# Patient Record
Sex: Male | Born: 1935 | Race: White | Hispanic: No | Marital: Married | Smoking: Never smoker
Health system: Southern US, Community
[De-identification: ages and names within clinical notes are randomized; demographics above are authoritative.]

## PROBLEM LIST (undated history)

## (undated) DIAGNOSIS — E039 Hypothyroidism, unspecified: Secondary | ICD-10-CM

## (undated) DIAGNOSIS — H3320 Serous retinal detachment, unspecified eye: Secondary | ICD-10-CM

## (undated) DIAGNOSIS — E669 Obesity, unspecified: Secondary | ICD-10-CM

## (undated) DIAGNOSIS — I480 Paroxysmal atrial fibrillation: Secondary | ICD-10-CM

## (undated) DIAGNOSIS — E785 Hyperlipidemia, unspecified: Secondary | ICD-10-CM

## (undated) DIAGNOSIS — C439 Malignant melanoma of skin, unspecified: Secondary | ICD-10-CM

## (undated) DIAGNOSIS — R413 Other amnesia: Secondary | ICD-10-CM

## (undated) DIAGNOSIS — K219 Gastro-esophageal reflux disease without esophagitis: Secondary | ICD-10-CM

## (undated) DIAGNOSIS — I259 Chronic ischemic heart disease, unspecified: Secondary | ICD-10-CM

## (undated) DIAGNOSIS — IMO0001 Reserved for inherently not codable concepts without codable children: Secondary | ICD-10-CM

## (undated) HISTORY — DX: Obesity, unspecified: E66.9

## (undated) HISTORY — DX: Malignant melanoma of skin, unspecified: C43.9

## (undated) HISTORY — DX: Chronic ischemic heart disease, unspecified: I25.9

## (undated) HISTORY — DX: Serous retinal detachment, unspecified eye: H33.20

## (undated) HISTORY — DX: Paroxysmal atrial fibrillation: I48.0

## (undated) HISTORY — DX: Other amnesia: R41.3

## (undated) HISTORY — DX: Reserved for inherently not codable concepts without codable children: IMO0001

## (undated) HISTORY — DX: Gastro-esophageal reflux disease without esophagitis: K21.9

## (undated) HISTORY — DX: Hypothyroidism, unspecified: E03.9

## (undated) HISTORY — DX: Hyperlipidemia, unspecified: E78.5

---

## 1995-05-10 HISTORY — PX: CORONARY STENT PLACEMENT: SHX1402

## 1998-07-29 ENCOUNTER — Ambulatory Visit (HOSPITAL_COMMUNITY): Admission: RE | Admit: 1998-07-29 | Discharge: 1998-07-29 | Payer: Self-pay | Admitting: Gastroenterology

## 1998-08-25 ENCOUNTER — Ambulatory Visit: Admission: RE | Admit: 1998-08-25 | Discharge: 1998-08-25 | Payer: Self-pay | Admitting: Cardiology

## 1999-03-24 ENCOUNTER — Encounter: Admission: RE | Admit: 1999-03-24 | Discharge: 1999-03-24 | Payer: Self-pay | Admitting: Internal Medicine

## 2000-09-13 ENCOUNTER — Encounter (INDEPENDENT_AMBULATORY_CARE_PROVIDER_SITE_OTHER): Payer: Self-pay | Admitting: Specialist

## 2000-09-13 ENCOUNTER — Ambulatory Visit (HOSPITAL_COMMUNITY): Admission: RE | Admit: 2000-09-13 | Discharge: 2000-09-13 | Payer: Self-pay | Admitting: Gastroenterology

## 2001-02-22 ENCOUNTER — Emergency Department (HOSPITAL_COMMUNITY): Admission: EM | Admit: 2001-02-22 | Discharge: 2001-02-22 | Payer: Self-pay | Admitting: Emergency Medicine

## 2001-02-22 ENCOUNTER — Encounter: Payer: Self-pay | Admitting: Emergency Medicine

## 2002-11-02 ENCOUNTER — Emergency Department (HOSPITAL_COMMUNITY): Admission: EM | Admit: 2002-11-02 | Discharge: 2002-11-02 | Payer: Self-pay | Admitting: Emergency Medicine

## 2002-11-02 ENCOUNTER — Encounter: Payer: Self-pay | Admitting: Emergency Medicine

## 2003-03-17 ENCOUNTER — Encounter: Admission: RE | Admit: 2003-03-17 | Discharge: 2003-03-17 | Payer: Self-pay | Admitting: Internal Medicine

## 2003-03-18 ENCOUNTER — Encounter: Admission: RE | Admit: 2003-03-18 | Discharge: 2003-03-18 | Payer: Self-pay | Admitting: Internal Medicine

## 2004-03-03 ENCOUNTER — Encounter (INDEPENDENT_AMBULATORY_CARE_PROVIDER_SITE_OTHER): Payer: Self-pay | Admitting: *Deleted

## 2004-03-03 ENCOUNTER — Ambulatory Visit (HOSPITAL_COMMUNITY): Admission: RE | Admit: 2004-03-03 | Discharge: 2004-03-03 | Payer: Self-pay | Admitting: Gastroenterology

## 2005-06-24 ENCOUNTER — Encounter: Admission: RE | Admit: 2005-06-24 | Discharge: 2005-06-24 | Payer: Self-pay | Admitting: Cardiology

## 2006-07-06 ENCOUNTER — Emergency Department (HOSPITAL_COMMUNITY): Admission: EM | Admit: 2006-07-06 | Discharge: 2006-07-06 | Payer: Self-pay | Admitting: Emergency Medicine

## 2006-07-10 ENCOUNTER — Observation Stay (HOSPITAL_COMMUNITY): Admission: EM | Admit: 2006-07-10 | Discharge: 2006-07-13 | Payer: Self-pay | Admitting: Emergency Medicine

## 2006-07-16 ENCOUNTER — Emergency Department (HOSPITAL_COMMUNITY): Admission: EM | Admit: 2006-07-16 | Discharge: 2006-07-16 | Payer: Self-pay | Admitting: Emergency Medicine

## 2007-08-06 ENCOUNTER — Emergency Department (HOSPITAL_COMMUNITY): Admission: EM | Admit: 2007-08-06 | Discharge: 2007-08-06 | Payer: Self-pay | Admitting: Family Medicine

## 2008-01-05 IMAGING — CR DG CHEST 1V PORT
1 series · 1 of 1 positions shown · non-contrast
Comparison: 07/06/06.

CLINICAL DATA: Shortness of breath and tachycardia.  
 PORTABLE CHEST - 1 VIEW - 07/10/06:

[view not recorded]
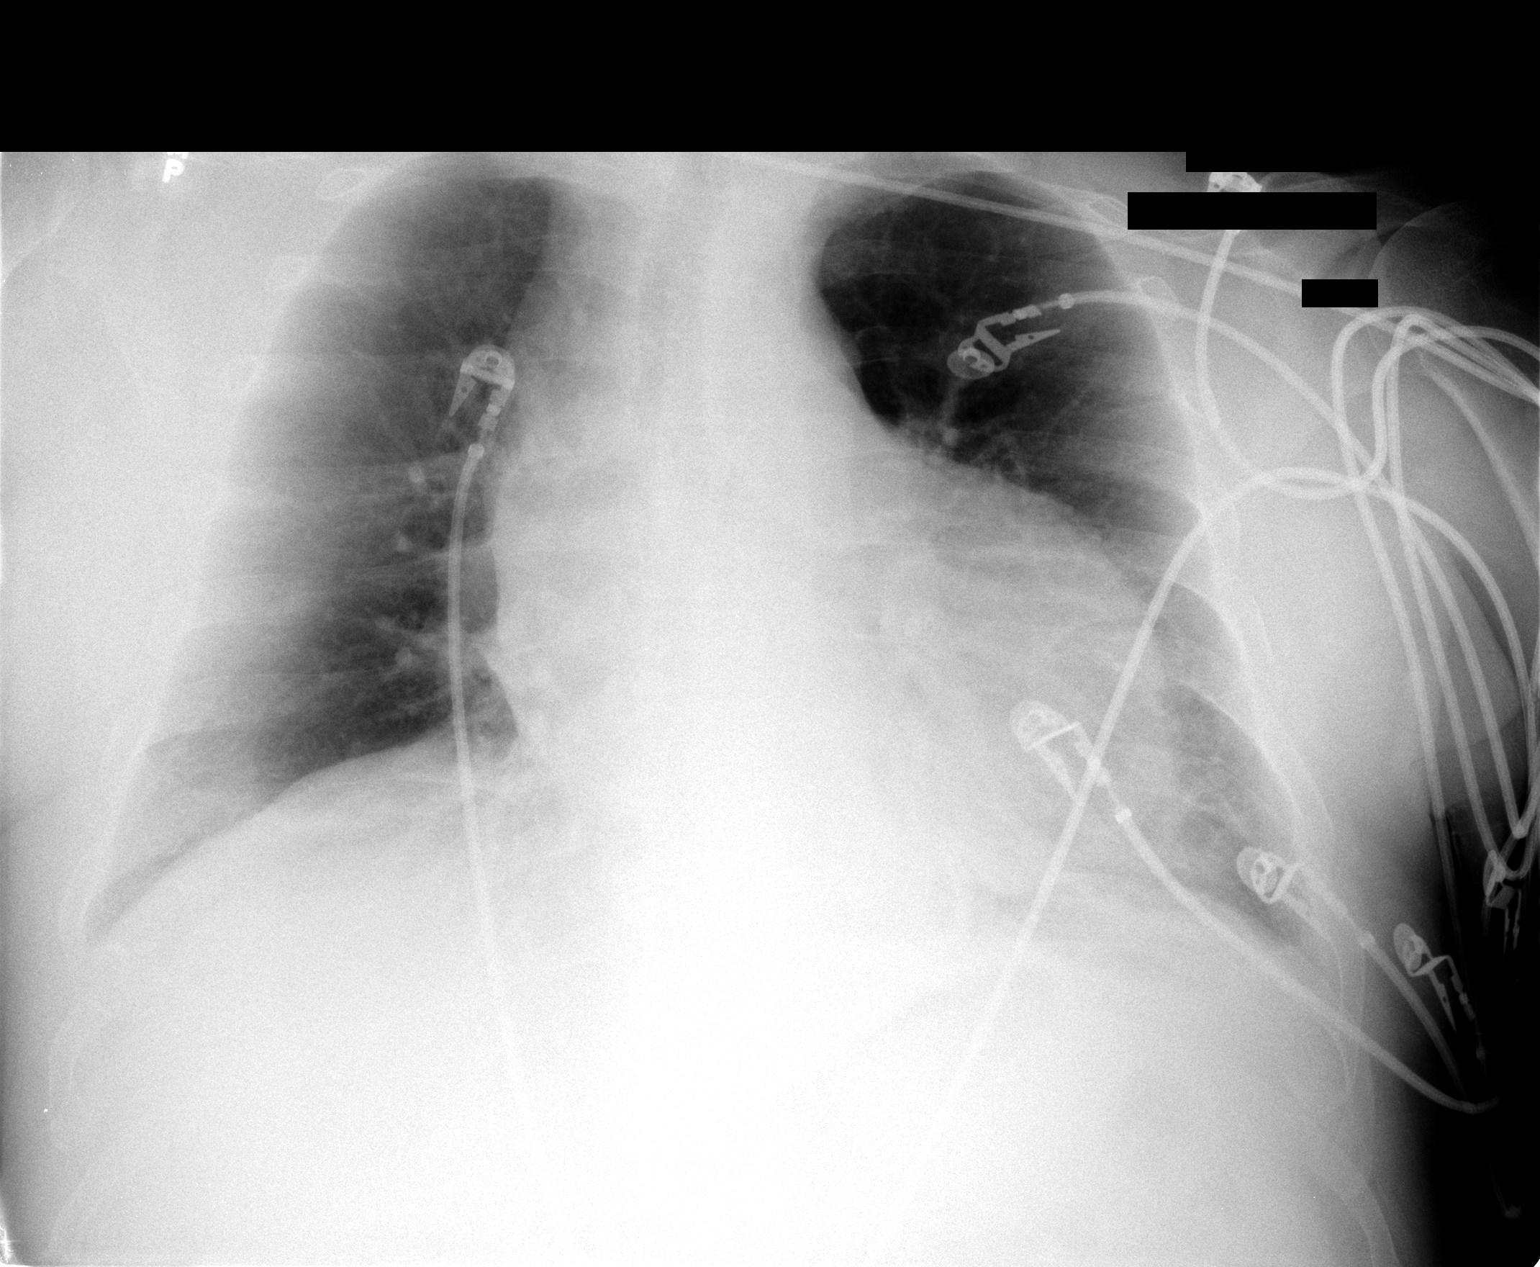

[1 of 1 positions shown; findings below may reference images not displayed]

FINDINGS: There is cardiomegaly.  There is no edema.  Mild left base atelectasis is noted.  No effusion.
IMPRESSION: 1.  Cardiomegaly.
 2.  Mild left basilar atelectasis.

## 2010-02-05 ENCOUNTER — Ambulatory Visit: Payer: Self-pay | Admitting: Vascular Surgery

## 2010-04-09 ENCOUNTER — Ambulatory Visit: Payer: Self-pay | Admitting: Cardiology

## 2010-04-28 ENCOUNTER — Ambulatory Visit: Payer: Self-pay | Admitting: Cardiology

## 2010-05-20 ENCOUNTER — Ambulatory Visit: Payer: Self-pay | Admitting: Cardiology

## 2010-07-21 ENCOUNTER — Ambulatory Visit (INDEPENDENT_AMBULATORY_CARE_PROVIDER_SITE_OTHER): Payer: Medicare Other | Admitting: Cardiology

## 2010-07-21 DIAGNOSIS — I4891 Unspecified atrial fibrillation: Secondary | ICD-10-CM

## 2010-07-21 DIAGNOSIS — E7800 Pure hypercholesterolemia, unspecified: Secondary | ICD-10-CM

## 2010-09-13 ENCOUNTER — Telehealth: Payer: Self-pay | Admitting: *Deleted

## 2010-09-14 ENCOUNTER — Telehealth: Payer: Self-pay | Admitting: Cardiology

## 2010-09-14 NOTE — Telephone Encounter (Signed)
Pt returning [facility] call about setting up stress test He said he can schedule it for May 30-31 of June 5-6 Call back and let him know which day

## 2010-09-21 ENCOUNTER — Telehealth: Payer: Self-pay | Admitting: Cardiology

## 2010-09-21 NOTE — Procedures (Signed)
DUPLEX DEEP VENOUS EXAM - LOWER EXTREMITY   INDICATION:  Palpable knot, left lower extremity pain.   HISTORY:  Edema:  No.  Trauma/Surgery:  No.  Pain:  Yes, left lower extremity.  PE:  No.  Previous DVT:  No.  Anticoagulants:  Yes.  Other:   DUPLEX EXAM:                CFV   SFV   PopV  PTV    GSV                R  L  R  L  R  L  R   L  R  L  Thrombosis    o  o     o     o      o     o  Spontaneous   +  +     +     +      +     +  Phasic        +  +     +     +      +     +  Augmentation  +  +     +     +      +     +  Compressible  +  +     +     +      +     +  Competent     +  +     +     +      +     +   Legend:  + - yes  o - no  p - partial  D - decreased   IMPRESSION:  1. No evidence of acute deep or superficial venous thrombosis within      the left lower extremity or right common femoral.  2. Results phone to Lauretta Chester Fine nurse practitioner at Dr.      Carolee Rota office.    _____________________________  Larina Earthly, M.D.   OD/MEDQ  D:  02/05/2010  T:  02/05/2010  Job:  [id number]

## 2010-09-21 NOTE — Telephone Encounter (Signed)
Please call patient back regarding scheduling his stress test.

## 2010-09-22 ENCOUNTER — Other Ambulatory Visit: Payer: Self-pay | Admitting: Cardiology

## 2010-09-22 DIAGNOSIS — I2510 Atherosclerotic heart disease of native coronary artery without angina pectoris: Secondary | ICD-10-CM

## 2010-09-22 NOTE — Telephone Encounter (Signed)
Pt notified of nuclear stress test on 10/12/10 at 0915.  Pt verbalized to RN understanding of nuclear instructions.

## 2010-09-22 NOTE — Telephone Encounter (Signed)
Pt notified of nuclear stress test appointment on 10/12/10.  Pt verbalized to RN understanding of nuclear instructions.

## 2010-09-24 NOTE — H&P (Signed)
NAME:  BASSEM, BERNASCONI NO.:  [id number]   MEDICAL RECORD NO.:  [id number]          PATIENT TYPE:  INP   LOCATION:  4708                         FACILITY:  [facility]   PHYSICIAN:  Lonia Blood, M.D.       DATE OF BIRTH:  05-29-1930   DATE OF ADMISSION:  07/10/2006  DATE OF DISCHARGE:                              HISTORY & PHYSICAL   Patient's primary care physician is Dr. Renne Crigler. The patient's primary  cardiologist is Dr. Lucas Mallow.   CHIEF COMPLAINT:  Don't feel too good.  Found to be in atrial  fibrillation.   HISTORY OF PRESENT ILLNESS:  Mr. Arthur Mcdaniel is a 77 year old gentleman  with a history coronary artery disease status post stent placement in  the circumflex artery in the remote past, diabetes-diet controlled,  hypertension, end-diastolic heart failure, who has been battling a viral  illness with complaints of occasional dyspnea.  He had been at the  emergency room for complaints of chest discomfort but ruled out for  myocardial infarction.  He woke up this morning with a feeling of  shortness of breath and diaphoresis.  So he presented to his primary  care physician where he was found to be in atrial fibrillation, rapid  ventricular response.  Then 9-1-1 was summoned and the patient was  placed on a Cardizem drip and was sent to the emergency room.  The  patient currently does not have any chest pain, does not have any  shortness of breath, does not have a cough.  He actually has converted  back to sinus rhythm here in emergency room.   PAST MEDICAL HISTORY:  1. Coronary artery disease, status post stent placement in 1997.  2. Hypertension.  3. Hyperlipidemia.  4. Hypothyroidism.  5. Gastroesophageal reflux disease.  6. Gout.  7. Status post adenomatous polyp removal x2.   MEDICATIONS:  1. Toprol XL 25 mg daily.  2. Aspirin 81 mg daily.  3. Allopurinol 300 mg daily.  4. AcipHex 20 mg twice a day.  5. Allegra 180 mg as needed.  6. Levoxyl 150 mcg  daily.  7. Zocor 40 mg daily.  8. Combivent as needed.  9. Diovan 80 mg daily.   ALLERGIES:  The patient is allergic to CEFTIN, PROTONIX, EVISTA and Z-  PAK.   SOCIAL HISTORY:  The patient is married, lives in [place].  He is a  retired Art gallery manager.  Does not drink alcohol.  Does not smoke cigarettes.   FAMILY HISTORY:  The patient's mother died at age 69 of old age.  The  patient father died at age 35 of colon cancer.   REVIEW OF SYSTEMS:  Negative for abdominal pain, nausea and vomiting.  Positive for mild episode of diarrhea.  Positive for some abdominal  distension.  Positive for some lower extremity edema.   The patient's physical examination upon admission:  VITAL SIGNS:  Blood pressure 118/70, saturation 96% on room air, pulse  about 85, respirations about 16, temperature 97.1.  GENERAL:  No acute distress.  Alert and oriented to place, person and  time.  EYES:  Pupils equal,  round, react to light and accommodation.  Extraocular movements intact.  Conjunctiva pink.  NECK:  Supple.  No JVD.  CHEST:  Regular rate and rhythm without murmurs, rubs or gallops.  LUNGS:  Clear to auscultation bilaterally without wheeze, rhonchi or  crackles.  ABDOMEN:  Distended, soft, nontender.  Bowel sounds are present.  No  fluid wave appreciated.  LOWER EXTREMITIES:  Have +1 bilateral edema.  MUSCULOSKELETAL:  Intact muscle bulk and tone.   LABORATORY VALUES:  The only available lab values that I have is a white  blood cell count of 11.3, hemoglobin 12.5 and a platelet count of 236.  Also there is one set of cardiac markers with a troponin I and CK-MB  within normal limits.  EKG:  First one when the patient arrived in the  emergency room shows atrial fibrillation, rapid ventricular response.  Then there is a second one that is very concerning for atrial flutter  and the third one which shows a normal sinus rhythm with some  nonspecific ST-T changes.  Much unchanged from previous EKG.  A  portable  chest x-ray shows cardiomegaly and no acute infiltrates.   ASSESSMENT/PLAN:  1. Atrial fibrillation, rapid ventricular response that has already      converted spontaneously to sinus rhythm:  Plan is to observe the      patient on telemetry, obtain three sets of cardiac enzymes to      assure that this was not an ischemic event.  Also will check a TSH      to make sure that this event was not secondary to overly replaced      hypothyroidism.  At this point in time, the patient does not have      clinical signs of pneumonia or pulmonary embolus and does not seem      septic or infected in any way.  Further plans of treatment will be      dependent on the recurrence of the atrial fibrillation or the      persistence of the atrial fibrillation.  At this point in time, I      do not see a reason to anticoagulate this patient unless his atrial      fibrillation recurs and/or he has got some unstable angina or      positive cardiac enzymes.  2. Diastolic heart failure with mild exacerbation:  The patient seems      to be clinically in some mild volume overload most likely secondary      to the fact that he was prescribed a nonsteroidal recently.  He      also reports drinking a lot of water recently.  At this point in      time, I will check a BNP and place the patient on low dose      diuretic.      Lonia Blood, M.D.  Electronically Signed     SL/MEDQ  D:  07/10/2006  T:  07/10/2006  Job:  [id number]   cc:   Jaclyn Prime. Lucas Mallow, M.D.  Soyla Murphy. Renne Crigler, M.D.

## 2010-09-24 NOTE — Op Note (Signed)
NAME:  Arthur Mcdaniel, Arthur Mcdaniel               ACCOUNT NO.:  [id number]   MEDICAL RECORD NO.:  [id number]          PATIENT TYPE:  AMB   LOCATION:  ENDO                         FACILITY:  [facility]   PHYSICIAN:  Bernette Redbird, M.D.   DATE OF BIRTH:  11-Dec-1930   DATE OF PROCEDURE:  03/03/2004  DATE OF DISCHARGE:                                 OPERATIVE REPORT   PROCEDURE PERFORMED:  Colonoscopy with polypectomy and biopsy.   ENDOSCOPIST:  Florencia Reasons, M.D.   INDICATIONS FOR PROCEDURE:  Follow-up of colonic adenomas from three years  ago, plus family history of colon cancer.   FINDINGS:  Several small polyps.  Mild sigmoid diverticulosis.   DESCRIPTION OF PROCEDURE:  The nature, purpose and risks of the procedure  were familiar to the patient from prior examination and he provided written  consent.  Sedation was fentanyl 50 mcg and Versed 5 mg IV without  arrhythmias or desaturation.   Digital exam of the prostate was normal.  The Olympus adult video  colonoscope was advanced to the cecum and for a short distance into the  terminal ileum which had a lot of lymphoid hyperplasia but appeared  uninflamed.  Pullback was then performed around the colon.  The quality of  the prep was excellent and it is felt that all areas were well seen.   There was a 4 x 3 mm sessile polyp between some folds in the ascending colon  which was removed by cold snare technique and the base of it was touched up  with several cold biopsies.  A short distance above this in the midascending  colon, was a small sessile polyp transiently seen but never biopsies.  In  the left colon were two small sessile polyps removed by cold biopsy.  No  large polyps, cancer, colitis, or vascular malformations were seen.  There  was some mild sigmoid diverticulosis.  Retroflexion in the rectum and  reinspection of the rectum was unremarkable.   The patient tolerated the procedure well.  There were no apparent  complications.   IMPRESSION:  1.  Colon polyps removed as described above (2113).  2.  Prior history of colonic adenoma.  3.  Family history of colon cancer.   PLAN:  Anticipate probable colonoscopic follow-up in three years in view of  the numerous polyps identified today.       RB/MEDQ  D:  03/03/2004  T:  03/03/2004  Job:  [id number]   cc:   Veronia Beets  [address]  [place] [address]

## 2010-09-24 NOTE — Discharge Summary (Signed)
Arthur Mcdaniel, Arthur Mcdaniel               ACCOUNT NO.:  [id number]   MEDICAL RECORD NO.:  [id number]          PATIENT TYPE:  INP   LOCATION:  4708                         FACILITY:  [facility]   PHYSICIAN:  Hind I Elsaid, MD      DATE OF BIRTH:  [birth year]   DATE OF ADMISSION:  07/10/2006  DATE OF DISCHARGE:  07/13/2006                               DISCHARGE SUMMARY   PRIMARY CARE PHYSICIAN:  Dr. Renne Crigler.   CARDIOLOGIST:  Dr. Lucas Mallow.   DISCHARGE DIAGNOSES:  1. Paroxysmal atrial fibrillation was converted to normal sinus rhythm  2. Coronary artery disease status post stent placement in 1997.  3. Hypertension.  4. Hyperlipidemia.  5. Hypothyroidism.  6. Gastroesophageal reflux.  7. Gout.   MEDICATIONS:  1. Lopressor 25 mg p.o. b.i.d.  2. Cardizem CD 120 mg p.o. daily.  3. Aspirin 81 mg.  4. Allopurinol 300 mg p.o. daily.  5. Aciphex 20 mg p.o. b.i.d.  6. Levothyroxine 150 mcg p.o. daily.  7. Zocor 40 mg p.o. daily.  8. Combivent as needed.  9. Coumadin 7.5 mg p.o. for one week.  10.Lovenox 150 mg p.o. daily.   Follow PT-INR with Dr. Renne Crigler on tomorrow.   CONSULTATIONS:  Dr. Lucas Mallow consulted for the AFib with RVR.   PROCEDURE:  A 2-D echo with ejection fraction 60% with diastolic heart  failure.   ASSESSMENT/PLAN:  1. Paroxysmal atrial fibrillation which converted today to normal      sinus rhythm.  Will start the patient on beta-blocker and calcium      channel blockers during hospitalization.  Patient developed a      decrease in her blood pressure with sinus tachycardia after a high      dose of Toprol XL 100 mg so will decrease metoprolol to 25 mg p.o.      b.i.d. and will change Cardizem to slowest release, 120 mg p.o.      daily.  Patient will follow the heart rate control with Dr. Lucas Mallow      on Friday.  Also, patient will be discharged on anticoagulation,      Lovenox and Coumadin 7.5.  I discussed with Dr. Renne Crigler regarding      patient's discharge with Coumadin and Lovenox  and he will see him      in his office tomorrow for further adjustment of the INR.  Today,      the heart rate around 85 with blood pressure around 110/60 and      will hold Diovan, as the patient had evidence of low blood      pressures during hospitalization due to high dose of beta-blocker      and ACE inhibitor.  2. Diastolic heart failure exacerbation.  Patient was euvolemic during      hospitalization and no need for any Lasix at this time.  We held      Diovan to be started with Dr. Lucas Mallow in his office and patient will      follow up with Dr. Lucas Mallow as outpatient.   DISPOSITION:  The patient to be discharged on Coumadin  and Lovenox, INR  today 1.4, hemoglobin is 12.  As far as the adjustment of heart rate and  INR to be done as outpatient with Dr. Lucas Mallow and Dr. Renne Crigler.  I discussed  with Dr. Renne Crigler over the phone.      Hind Bosie Helper, MD  Electronically Signed     HIE/MEDQ  D:  07/13/2006  T:  07/13/2006  Job:  [id number]   cc:   Jaclyn Prime. Lucas Mallow, M.D.  Soyla Murphy. Renne Crigler, M.D.

## 2010-09-24 NOTE — Procedures (Signed)
[place]  Patient:    Arthur Mcdaniel, Arthur Mcdaniel                      MRN: [id number] Proc. Date: 09/13/00 Adm. Date:  [id number] Attending:  Rich Brave CC:         Soyla Murphy. Renne Crigler, M.D.   Procedure Report  PROCEDURE PERFORMED:  Colonoscopy with polypectomy and biopsy.  ENDOSCOPIST:  Florencia Reasons, M.D.  INDICATIONS FOR PROCEDURE:  The patient is a 77 year old with a family history of colon cancer and a previous history of colonic adenoma with most recent colonoscopy two years ago having been negative.  FINDINGS:  Medium-sized polyp removed from the ascending colon.  Diminutive polyp removed near splenic flexure.  DESCRIPTION OF PROCEDURE:  The nature, purpose and risks of the procedure were familiar to the patient, who provided written consent.  Sedation was fentanyl 50 mcg and Versed 5 mg without arrhythmias or desaturation.  Digital exam of the prostate was normal.  The Olympus adult video colonoscope was readily advanced to the cecum as identified by clear visualization of the appendiceal orifice and ileocecal valve.  Pull-back was then performed. The quality of the prep was excellent and it is felt that all areas were well seen.  There was a 4 x 9 mm sessile polyp, pale in appearance, inside the sulcus a short distance above the ileocecal valve, probably about 3 or 4 haustrations. I injected the base with a total of about 1.5 cc of 1:10,000 epinephrine which led to nice blanching and moderate bleb formation.  The polyp was then snared off piecemeal and several fragments were retrieved for histologic analysis. There was good hemostasis (patient remained on aspirin prior to this procedure at my recommendation in view of the coronary stent having been placed about five years ago) and there was no evidence of excessive cautery.  I also encountered a sessile 2 mm polyp near the splenic flexure removed by a single cold biopsy.  No  other polyps were seen and there was no evidence of cancer colitis, vascular malformations or diverticulosis although there did appear to be some prediverticular change in the sigmoid region.  Retroflexion in the rectum demonstrated a prominent rectal vein but was otherwise normal.  The patient tolerated the procedure well and there were no apparent complications.  IMPRESSION:  Two colon polyps removed as described above.  Family history of colon cancer and prior history of adenomatous polyps.  PLAN:  Await pathology on polyps.  Follow-up colonoscopy in three to five years.DD:  09/13/00 TD:  09/13/00 Job: [id number]

## 2010-10-08 DIAGNOSIS — IMO0001 Reserved for inherently not codable concepts without codable children: Secondary | ICD-10-CM

## 2010-10-08 HISTORY — DX: Reserved for inherently not codable concepts without codable children: IMO0001

## 2010-10-11 ENCOUNTER — Telehealth: Payer: Self-pay | Admitting: Cardiology

## 2010-10-11 NOTE — Telephone Encounter (Signed)
Fax: [phone] Latest OV, EKG, Nuc study

## 2010-10-12 ENCOUNTER — Ambulatory Visit (HOSPITAL_COMMUNITY): Payer: Medicare Other | Attending: Cardiology | Admitting: Radiology

## 2010-10-12 VITALS — Ht 71.0 in | Wt 207.0 lb

## 2010-10-12 DIAGNOSIS — R0609 Other forms of dyspnea: Secondary | ICD-10-CM

## 2010-10-12 DIAGNOSIS — I2510 Atherosclerotic heart disease of native coronary artery without angina pectoris: Secondary | ICD-10-CM | POA: Insufficient documentation

## 2010-10-12 DIAGNOSIS — R55 Syncope and collapse: Secondary | ICD-10-CM

## 2010-10-12 DIAGNOSIS — R0989 Other specified symptoms and signs involving the circulatory and respiratory systems: Secondary | ICD-10-CM

## 2010-10-12 MED ORDER — TECHNETIUM TC 99M TETROFOSMIN IV KIT
11.0000 | PACK | Freq: Once | INTRAVENOUS | Status: AC | PRN
  Administered 2010-10-12: 11 via INTRAVENOUS

## 2010-10-12 MED ORDER — TECHNETIUM TC 99M TETROFOSMIN IV KIT
33.0000 | PACK | Freq: Once | INTRAVENOUS | Status: AC | PRN
  Administered 2010-10-12: 33 via INTRAVENOUS

## 2010-10-12 NOTE — Progress Notes (Signed)
[facility] SITE 3 NUCLEAR MED [address] [place] [address] [phone]  Cardiology Nuclear Med Study  Arthur Mcdaniel is a 77 y.o. male [id number] Feb 21, 1931   Nuclear Med Background Indication for Stress Test:  Evaluation for Ischemia and Stent Patency History: '97 Heart Catheterization:,12/09 Myocardial Perfusion Study: EF 64% NL and '97 Stents LCFX Cardiac Risk Factors: Family History - CAD, History of Smoking and Lipids  Symptoms:  DOE and Syncope   Nuclear Pre-Procedure Caffeine/Decaff Intake:  None NPO After: 8:00pm   Lungs:  clear IV 0.9% NS with Angio Cath:  20g  IV Site: R Wrist  IV Started by:  Stanton Kidney, EMT-P  Chest Size (in):  42 Cup Size: n/a  Height: 5\' 11"  (1.803 m)  Weight:  207 lb (93.895 kg)  BMI:  Body mass index is 28.87 kg/(m^2). Tech Comments:  Bystolic held > 24 hours, per patient.    Nuclear Med Study 1 or 2 day study: 1 day  Stress Test Type:  Stress  Reading MD: Charlton Haws, MD  Order Authorizing Provider:  S.Tennant  Resting Radionuclide: Technetium 29m Tetrofosmin  Resting Radionuclide Dose: 11 mCi   Stress Radionuclide:  Technetium 20m Tetrofosmin  Stress Radionuclide Dose: 33 mCi           Stress Protocol Rest HR: 52 Stress HR: 105  Rest BP: 119/74 Stress BP: 157/56  Exercise Time (min): 8:12 METS: 8.30   Predicted Max HR: 143 bpm % Max HR: 73.43 bpm Rate Pressure Product: 21308   Dose of Adenosine (mg):  n/a Dose of Lexiscan: 0.4 mg  Dose of Atropine (mg): n/a Dose of Dobutamine: n/a mcg/kg/min (at max HR)  Stress Test Technologist: Milana Na, EMT-P  Nuclear Technologist:  Domenic Polite, CNMT     Rest Procedure:  Myocardial perfusion imaging was performed at rest 45 minutes following the intravenous administration of Technetium 24m Tetrofosmin. Rest ECG: Sinus Bradycardia  Stress Procedure:  The patient received IV Lexiscan 0.4 mg over 15-seconds with concurrent low level exercise and then  Technetium 36m Tetrofosmin was injected at 30-seconds while the patient continued walking one more minute.  There were no significant changes with Lexiscan.  Quantitative spect images were obtained after a 45-minute delay. Stress ECG: No significant change from baseline ECG  QPS Raw Data Images:  Normal; no motion artifact; normal heart/lung ratio. Stress Images:  Normal homogeneous uptake in all areas of the myocardium. Rest Images:  Normal homogeneous uptake in all areas of the myocardium. Subtraction (SDS):  SDS 1 Transient Ischemic Dilatation (Normal <1.22):  0.92  Lung/Heart Ratio (Normal <0.45):  0.37  Quantitative Gated Spect Images QGS EDV:  117 ml QGS ESV:  41 ml QGS cine images:  NL LV Function; NL Wall Motion QGS EF: 65%  Impression Exercise Capacity:  Lexiscan with low level exercise. BP Response:  Normal blood pressure response. Clinical Symptoms:  There is dyspnea. ECG Impression:  No significant ST segment change suggestive of ischemia. Comparison with Prior Nuclear Study: No images to compare  Overall Impression:  Normal stress nuclear study.      Charlton Haws

## 2010-10-13 NOTE — Progress Notes (Signed)
COPY ROUTED TO DR.TENNAT.Scarlette Ar

## 2010-10-18 NOTE — Progress Notes (Signed)
Normal findings

## 2010-10-19 ENCOUNTER — Telehealth: Payer: Self-pay | Admitting: *Deleted

## 2010-10-19 NOTE — Telephone Encounter (Signed)
Pt notified of nuclear results.   

## 2010-12-31 ENCOUNTER — Encounter: Payer: Self-pay | Admitting: Nurse Practitioner

## 2011-01-20 ENCOUNTER — Other Ambulatory Visit: Payer: Self-pay | Admitting: *Deleted

## 2011-01-20 ENCOUNTER — Ambulatory Visit (INDEPENDENT_AMBULATORY_CARE_PROVIDER_SITE_OTHER): Payer: Medicare Other | Admitting: Nurse Practitioner

## 2011-01-20 ENCOUNTER — Encounter: Payer: Self-pay | Admitting: Nurse Practitioner

## 2011-01-20 DIAGNOSIS — I4891 Unspecified atrial fibrillation: Secondary | ICD-10-CM

## 2011-01-20 DIAGNOSIS — E785 Hyperlipidemia, unspecified: Secondary | ICD-10-CM

## 2011-01-20 DIAGNOSIS — I10 Essential (primary) hypertension: Secondary | ICD-10-CM | POA: Insufficient documentation

## 2011-01-20 DIAGNOSIS — I2510 Atherosclerotic heart disease of native coronary artery without angina pectoris: Secondary | ICD-10-CM

## 2011-01-20 DIAGNOSIS — I480 Paroxysmal atrial fibrillation: Secondary | ICD-10-CM | POA: Insufficient documentation

## 2011-01-20 MED ORDER — NITROGLYCERIN 0.4 MG SL SUBL: 0.4000 mg | SUBLINGUAL_TABLET | SUBLINGUAL | Status: AC | PRN

## 2011-01-20 NOTE — Assessment & Plan Note (Signed)
Labs are checked by PCP. 

## 2011-01-20 NOTE — Assessment & Plan Note (Signed)
Blood pressure is up here today. He has had his medicines today. We will have him monitor closely at home. Goal is less than 135/85.

## 2011-01-20 NOTE — Patient Instructions (Signed)
I want you to keep a close check of your blood pressure. It is elevated today. Your goal is for it to be less than 135/85 Stay active. We will see you back in 6 months. You will see Dr. Swaziland at that time. Call for any problems.

## 2011-01-20 NOTE — Progress Notes (Signed)
Arthur Mcdaniel Date of Birth: Apr 25, 1931   History of Present Illness: Arthur Mcdaniel is seen today for a follow up visit. He is seen for Dr. Swaziland. He is a former patient of Dr. Ronnald Nian. He is doing well. No chest pain or shortness of breath. He is walking 2 miles every day. He is tolerating low dose statin. Labs are checked by Dr. Renne Crigler. Blood pressure is up today. He says it was fine at Dr. Carolee Rota office 2 weeks ago. He has gotten a little lax with checking it at home. He feels fine. No recurrent atrial fib.   Current Outpatient Prescriptions on File Prior to Visit  Medication Sig Dispense Refill  . allopurinol (ZYLOPRIM) 300 MG tablet Take 300 mg by mouth daily.        Marland Kitchen amLODipine (NORVASC) 5 MG tablet Take 5 mg by mouth daily.        Marland Kitchen aspirin 81 MG tablet Take 81 mg by mouth daily.        Marland Kitchen esomeprazole (NEXIUM) 40 MG capsule Take 40 mg by mouth as needed.        . fluticasone (FLONASE) 50 MCG/ACT nasal spray Place 2 sprays into the nose as needed.        Marland Kitchen levothyroxine (SYNTHROID, LEVOTHROID) 137 MCG tablet Take 137 mcg by mouth daily.        . nebivolol (BYSTOLIC) 5 MG tablet Take 5 mg by mouth daily.        . simvastatin (ZOCOR) 20 MG tablet Take 20 mg by mouth at bedtime.        . vardenafil (LEVITRA) 20 MG tablet Take 20 mg by mouth daily as needed.        . vitamin B-12 (CYANOCOBALAMIN) 1000 MCG tablet Take 1,000 mcg by mouth daily.        Marland Kitchen DISCONTD: NITROGLYCERIN PO Take by mouth as needed.          Allergies  Allergen Reactions  . Naproxen     Past Medical History  Diagnosis Date  . URI (upper respiratory infection)   . Arrhythmia     atrial fibrillation  . Ischemic heart disease   . Gout   . Thyroid disease     hypothyroidism  . Hyperlipidemia   . GERD (gastroesophageal reflux disease)   . Cancer     melanoma  . Memory deficit   . Detached retina   . Obese     No past surgical history on file.  History  Smoking status  . Never Smoker     Smokeless tobacco  . Not on file    History  Alcohol Use No    No family history on file.  Review of Systems: The review of systems is as above. No recent gout flare ups. He feels good overall.  All other systems were reviewed and are negative.  Physical Exam: BP 158/92  Pulse 62  Ht 5\' 11"  (1.803 m)  Wt 209 lb (94.802 kg)  BMI 29.15 kg/m2 Repeat blood pressure by me is 160/80 Patient is very pleasant and in no acute distress. He is obese. Skin is warm and dry. Color is normal.  HEENT is unremarkable. Normocephalic/atraumatic. PERRL. Sclera are nonicteric. Neck is supple. No masses. No JVD. Lungs are clear. Cardiac exam shows a regular rate and rhythm. Abdomen is obese but soft. Extremities are without edema. Gait and ROM are intact. No gross neurologic deficits noted.  LABORATORY DATA: n/a   Assessment /  Plan:

## 2011-01-20 NOTE — Assessment & Plan Note (Addendum)
He is doing well. Risk factor modification is encouraged. We will see him back in 6 months. Dr. Swaziland will be assuming his care. Patient is agreeable to this plan and will call if any problems develop in the interim.

## 2011-01-20 NOTE — Assessment & Plan Note (Signed)
No recurrence. 

## 2011-01-20 NOTE — Telephone Encounter (Signed)
Refilled Meds from fax  

## 2011-02-17 ENCOUNTER — Other Ambulatory Visit: Payer: Self-pay | Admitting: Dermatology

## 2011-03-24 ENCOUNTER — Other Ambulatory Visit: Payer: Self-pay | Admitting: Dermatology

## 2011-07-04 ENCOUNTER — Other Ambulatory Visit: Payer: Self-pay | Admitting: *Deleted

## 2011-07-04 MED ORDER — AMLODIPINE BESYLATE 5 MG PO TABS: 5.0000 mg | ORAL_TABLET | Freq: Every day | ORAL | Status: DC

## 2011-07-21 ENCOUNTER — Other Ambulatory Visit: Payer: Self-pay

## 2011-07-21 MED ORDER — AMLODIPINE BESYLATE 5 MG PO TABS: 5.0000 mg | ORAL_TABLET | Freq: Every day | ORAL | Status: DC

## 2011-07-27 ENCOUNTER — Ambulatory Visit: Payer: Medicare Other | Admitting: Cardiology

## 2011-08-23 ENCOUNTER — Encounter: Payer: Self-pay | Admitting: Cardiology

## 2011-08-23 ENCOUNTER — Ambulatory Visit (INDEPENDENT_AMBULATORY_CARE_PROVIDER_SITE_OTHER): Payer: Medicare Other | Admitting: Cardiology

## 2011-08-23 VITALS — BP 142/72 | HR 63 | Ht 71.0 in | Wt 211.4 lb

## 2011-08-23 DIAGNOSIS — I4891 Unspecified atrial fibrillation: Secondary | ICD-10-CM

## 2011-08-23 DIAGNOSIS — I10 Essential (primary) hypertension: Secondary | ICD-10-CM

## 2011-08-23 DIAGNOSIS — R5381 Other malaise: Secondary | ICD-10-CM

## 2011-08-23 DIAGNOSIS — I2510 Atherosclerotic heart disease of native coronary artery without angina pectoris: Secondary | ICD-10-CM

## 2011-08-23 DIAGNOSIS — I480 Paroxysmal atrial fibrillation: Secondary | ICD-10-CM

## 2011-08-23 DIAGNOSIS — R5383 Other fatigue: Secondary | ICD-10-CM

## 2011-08-23 NOTE — Assessment & Plan Note (Signed)
His last episode of atrial fibrillation was over 5 years ago. He has had no symptomatic recurrence. We will continue to monitor for signs or symptoms of recurrence.

## 2011-08-23 NOTE — Assessment & Plan Note (Signed)
Status post remote stenting of the left circumflex coronary in 1997. No recurrent anginal symptoms. Continue aspirin therapy.

## 2011-08-23 NOTE — Assessment & Plan Note (Signed)
Blood pressure is in excellent control. I am concerned about the fact that his heart rate response is limited in his energy level is low. I recommended tapering off of bystolic and we'll see if his symptoms improve. If his blood pressure increases then an ACE inhibitor or ARB may be a more appropriate blood pressure medication.

## 2011-08-23 NOTE — Progress Notes (Signed)
Arthur Mcdaniel Date of Birth: 01-12-1931 Medical Record #[id number]  History of Present Illness: Arthur Mcdaniel is seen today to establish cardiac care. He is a former patient of Dr. Deborah Chalk. He has a history of coronary disease and underwent stenting of the left circumflex coronary in 1997. He also has a prior history of atrial fibrillation during a bout of pleurisy in 2008 but has been in sinus rhythm since then. He has a history of hypercholesterolemia, mild glucose intolerance, and hypertension. His last nuclear stress test in June of 2012 was normal with an ejection fraction of 65%. He continues to feel very well. His only complaint is that his energy level is low especially later in the day. He does note that it takes 15 minutes of vigorous walking to get his heart rate over 100. He is still able to mow grass for 3 hours.  Current Outpatient Prescriptions on File Prior to Visit  Medication Sig Dispense Refill  . allopurinol (ZYLOPRIM) 300 MG tablet Take 300 mg by mouth daily.        Marland Kitchen amLODipine (NORVASC) 5 MG tablet Take 1 tablet (5 mg total) by mouth daily.  90 tablet  3  . aspirin 81 MG tablet Take 81 mg by mouth daily.        Marland Kitchen esomeprazole (NEXIUM) 40 MG capsule Take 40 mg by mouth as needed.        . fluticasone (FLONASE) 50 MCG/ACT nasal spray Place 2 sprays into the nose as needed.        Marland Kitchen levothyroxine (SYNTHROID, LEVOTHROID) 137 MCG tablet Take 137 mcg by mouth daily.        . nitroGLYCERIN (NITROSTAT) 0.4 MG SL tablet Place 1 tablet (0.4 mg total) under the tongue every 5 (five) minutes as needed.  25 tablet  11  . simvastatin (ZOCOR) 20 MG tablet Take 20 mg by mouth at bedtime.        . vardenafil (LEVITRA) 20 MG tablet Take 20 mg by mouth daily as needed.        . vitamin B-12 (CYANOCOBALAMIN) 1000 MCG tablet Take 1,000 mcg by mouth daily.          Allergies  Allergen Reactions  . Naproxen     Past Medical History  Diagnosis Date  . PAF (paroxysmal atrial  fibrillation)   . Ischemic heart disease     Remote stent to LCX in 1997  . Gout   . Hypothyroidism   . Hyperlipidemia     Myalgias with Lipitor  . GERD (gastroesophageal reflux disease)   . Melanoma   . Memory deficit   . Detached retina   . Obese   . Normal nuclear stress test June 2012    Past Surgical History  Procedure Date  . Coronary stent placement 1997    LCX    History  Smoking status  . Never Smoker   Smokeless tobacco  . Not on file    History  Alcohol Use No    Family History  Problem Relation Age of Onset  . Heart disease Sister     Review of Systems: The review of systems is positive for some fatigue.  All other systems were reviewed and are negative.  Physical Exam: BP 142/72  Pulse 63  Ht 5\' 11"  (1.803 m)  Wt 211 lb 6.4 oz (95.89 kg)  BMI 29.48 kg/m2 He is a pleasant white male in no acute distress. HEENT exam is unremarkable. Sclera are clear. Pupils  are equal round and reactive. Neck is without JVD, adenopathy, thyromegaly, or bruits. Lungs are clear. Cardiac exam reveals a regular rate and rhythm without gallop, murmur, or click. Abdomen is obese, soft, nontender. There are no masses. Extremities are without edema. Pulses are 2+. He is alert and oriented x3. Cranial nerves II through XII are intact. Skin is warm and dry. LABORATORY DATA: ECG today demonstrates normal sinus rhythm with occasional PACs and a bigeminal pattern. There is a mild nonspecific ST abnormality.  Assessment / Plan:

## 2011-08-23 NOTE — Patient Instructions (Signed)
I will get a copy of your lab work from Dr. Renne Crigler.  Taper off Bystolic- take 1/2 tablet daily for 4 days then discontinue.  Watch your blood pressure closely. If it goes up we will try a different medication

## 2011-08-24 ENCOUNTER — Encounter: Payer: Self-pay | Admitting: Cardiology

## 2012-02-17 ENCOUNTER — Ambulatory Visit (INDEPENDENT_AMBULATORY_CARE_PROVIDER_SITE_OTHER): Payer: Medicare Other | Admitting: Cardiology

## 2012-02-17 ENCOUNTER — Encounter: Payer: Self-pay | Admitting: Cardiology

## 2012-02-17 VITALS — BP 132/66 | HR 70 | Ht 71.0 in | Wt 208.8 lb

## 2012-02-17 DIAGNOSIS — I480 Paroxysmal atrial fibrillation: Secondary | ICD-10-CM

## 2012-02-17 DIAGNOSIS — I4891 Unspecified atrial fibrillation: Secondary | ICD-10-CM

## 2012-02-17 DIAGNOSIS — I10 Essential (primary) hypertension: Secondary | ICD-10-CM

## 2012-02-17 DIAGNOSIS — E785 Hyperlipidemia, unspecified: Secondary | ICD-10-CM

## 2012-02-17 DIAGNOSIS — I2510 Atherosclerotic heart disease of native coronary artery without angina pectoris: Secondary | ICD-10-CM

## 2012-02-17 NOTE — Progress Notes (Signed)
Arthur Mcdaniel Date of Birth: [birth year] Medical Record #[id number]  History of Present Illness: Arthur Mcdaniel is seen today to for followup. He has a history of coronary disease and underwent stenting of the left circumflex coronary in 1997. He also has a prior history of atrial fibrillation during a bout of pleurisy in 2008 but has been in sinus rhythm since then. He has a history of hypercholesterolemia, mild glucose intolerance, and hypertension. His last nuclear stress test in June of 2012 was normal with an ejection fraction of 65%. He reports he is doing very well. He denies any chest pain, shortness of breath, or palpitations. [facility] He did have a recent root canal. Otherwise he has no complaints.  Current Outpatient Prescriptions on File Prior to Visit  Medication Sig Dispense Refill  . allopurinol (ZYLOPRIM) 300 MG tablet Take 300 mg by mouth daily.        Marland Kitchen amLODipine (NORVASC) 5 MG tablet Take 1 tablet (5 mg total) by mouth daily.  90 tablet  3  . aspirin 81 MG tablet Take 81 mg by mouth daily.        Marland Kitchen esomeprazole (NEXIUM) 40 MG capsule Take 40 mg by mouth as needed.        . fluticasone (FLONASE) 50 MCG/ACT nasal spray Place 2 sprays into the nose as needed.        Marland Kitchen levothyroxine (SYNTHROID, LEVOTHROID) 137 MCG tablet Take 137 mcg by mouth daily.        . nitroGLYCERIN (NITROSTAT) 0.4 MG SL tablet Place 1 tablet (0.4 mg total) under the tongue every 5 (five) minutes as needed.  25 tablet  11  . simvastatin (ZOCOR) 20 MG tablet Take 20 mg by mouth at bedtime.        . vardenafil (LEVITRA) 20 MG tablet Take 20 mg by mouth daily as needed.        . vitamin B-12 (CYANOCOBALAMIN) 1000 MCG tablet Take 1,000 mcg by mouth daily.          Allergies  Allergen Reactions  . Naproxen     Past Medical History  Diagnosis Date  . PAF (paroxysmal atrial fibrillation)   . Ischemic heart disease     Remote stent to LCX in 1997  . Gout   .  Hypothyroidism   . Hyperlipidemia     Myalgias with Lipitor  . GERD (gastroesophageal reflux disease)   . Melanoma   . Memory deficit   . Detached retina   . Obese   . Normal nuclear stress test June 2012    Past Surgical History  Procedure Date  . Coronary stent placement 1997    LCX    History  Smoking status  . Never Smoker   Smokeless tobacco  . Not on file    History  Alcohol Use No    Family History  Problem Relation Age of Onset  . Heart disease Sister     Review of Systems: As noted in history of present illness.  All other systems were reviewed and are negative.  Physical Exam: BP 132/66  Pulse 70  Ht 5\' 11"  (1.803 m)  Wt 94.711 kg (208 lb 12.8 oz)  BMI 29.12 kg/m2 He is a pleasant white male in no acute distress. HEENT exam is unremarkable. Sclera are clear. Pupils are equal round and reactive. Neck is without JVD, adenopathy, thyromegaly, or bruits. Lungs are clear. Cardiac exam reveals a regular rate  and rhythm without gallop, murmur, or click. Abdomen is obese, soft, nontender. There are no masses. Extremities are without edema. Pulses are 2+. He is alert and oriented x3. Cranial nerves II through XII are intact. Skin is warm and dry. LABORATORY DATA:   Assessment / Plan: 1. CAD status post stenting of the left circumflex coronary in 1997. Normal nuclear stress test June of 2012. He is asymptomatic. Continue aspirin and statin therapy. Beta blocker previously discontinued for chronotropic incompetence.  2. Remote history of paroxysmal atrial fibrillation. No recurrence.  3. Hyperlipidemia, on Zocor.  4. Hypertension, controlled.

## 2012-02-17 NOTE — Patient Instructions (Signed)
Continue your current therapy  I will see you again in 6 months.   

## 2012-08-03 ENCOUNTER — Other Ambulatory Visit: Payer: Self-pay

## 2012-08-03 MED ORDER — AMLODIPINE BESYLATE 5 MG PO TABS: 5.0000 mg | ORAL_TABLET | Freq: Every day | ORAL | Status: DC

## 2012-08-15 ENCOUNTER — Ambulatory Visit (INDEPENDENT_AMBULATORY_CARE_PROVIDER_SITE_OTHER): Payer: Medicare Other | Admitting: Cardiology

## 2012-08-15 ENCOUNTER — Encounter: Payer: Self-pay | Admitting: Cardiology

## 2012-08-15 VITALS — BP 120/64 | HR 70 | Ht 71.0 in | Wt 209.4 lb

## 2012-08-15 DIAGNOSIS — I480 Paroxysmal atrial fibrillation: Secondary | ICD-10-CM

## 2012-08-15 DIAGNOSIS — E785 Hyperlipidemia, unspecified: Secondary | ICD-10-CM

## 2012-08-15 DIAGNOSIS — I2510 Atherosclerotic heart disease of native coronary artery without angina pectoris: Secondary | ICD-10-CM

## 2012-08-15 DIAGNOSIS — I10 Essential (primary) hypertension: Secondary | ICD-10-CM

## 2012-08-15 DIAGNOSIS — I4891 Unspecified atrial fibrillation: Secondary | ICD-10-CM

## 2012-08-15 NOTE — Progress Notes (Signed)
Arthur Mcdaniel Date of Birth: 08/05/1930 Medical Record #[id number]  History of Present Illness: Arthur Mcdaniel is seen today to for followup. He has a history of coronary disease and underwent stenting of the left circumflex coronary in 1997. He also has a prior history of atrial fibrillation during a bout of pleurisy in 2008 but has been in sinus rhythm since then. He has a history of hypercholesterolemia, mild glucose intolerance, and hypertension. His last nuclear stress test in June of 2012 was normal with an ejection fraction of 65%. He continues to do very well. He does have some short-term memory loss. [facility] and [facility]. He has no limitation. He denies any chest pain or shortness of breath.  Current Outpatient Prescriptions on File Prior to Visit  Medication Sig Dispense Refill  . allopurinol (ZYLOPRIM) 300 MG tablet Take 300 mg by mouth daily.        Marland Kitchen amLODipine (NORVASC) 5 MG tablet Take 1 tablet (5 mg total) by mouth daily.  90 tablet  1  . aspirin 81 MG tablet Take 81 mg by mouth daily.        Marland Kitchen esomeprazole (NEXIUM) 40 MG capsule Take 40 mg by mouth as needed.        . fluticasone (FLONASE) 50 MCG/ACT nasal spray Place 2 sprays into the nose as needed.        Marland Kitchen levothyroxine (SYNTHROID, LEVOTHROID) 137 MCG tablet Take 137 mcg by mouth daily.        . nitroGLYCERIN (NITROSTAT) 0.4 MG SL tablet Place 1 tablet (0.4 mg total) under the tongue every 5 (five) minutes as needed.  25 tablet  11  . simvastatin (ZOCOR) 20 MG tablet Take 20 mg by mouth at bedtime.        . vardenafil (LEVITRA) 20 MG tablet Take 20 mg by mouth daily as needed.        . vitamin B-12 (CYANOCOBALAMIN) 1000 MCG tablet Take 1,000 mcg by mouth daily.         No current facility-administered medications on file prior to visit.    Allergies  Allergen Reactions  . Naproxen     Past Medical History  Diagnosis Date  . PAF (paroxysmal atrial fibrillation)   . Ischemic heart  disease     Remote stent to LCX in 1997  . Gout   . Hypothyroidism   . Hyperlipidemia     Myalgias with Lipitor  . GERD (gastroesophageal reflux disease)   . Melanoma   . Memory deficit   . Detached retina   . Obese   . Normal nuclear stress test June 2012    Past Surgical History  Procedure Laterality Date  . Coronary stent placement  1997    LCX    History  Smoking status  . Never Smoker   Smokeless tobacco  . Not on file    History  Alcohol Use No    Family History  Problem Relation Age of Onset  . Heart disease Sister     Review of Systems: As noted in history of present illness.  All other systems were reviewed and are negative.  Physical Exam: BP 120/64  Pulse 70  Ht 5\' 11"  (1.803 m)  Wt 209 lb 6.4 oz (94.983 kg)  BMI 29.22 kg/m2 He is a pleasant white male in no acute distress. HEENT exam is unremarkable. Sclera are clear. Pupils are equal round and reactive. Neck is without  JVD, adenopathy, thyromegaly, or bruits. Lungs are clear. Cardiac exam reveals a regular rate and rhythm without gallop, murmur, or click. Abdomen is obese, soft, nontender. There are no masses. Extremities are without edema. Pulses are 2+. He is alert and oriented x3. Cranial nerves II through XII are intact. Skin is warm and dry. LABORATORY DATA: ECG today demonstrates normal sinus rhythm with sinus arrhythmia. It is otherwise normal.  Assessment / Plan: 1. CAD status post stenting of the left circumflex coronary in 1997. Normal nuclear stress test June of 2012. He is asymptomatic. Continue aspirin and statin therapy. Beta blocker previously discontinued for chronotropic incompetence.  2. Remote history of paroxysmal atrial fibrillation. No recurrence.  3. Hyperlipidemia, on Zocor. He is scheduled for lab work with his primary care in May.  4. Hypertension, controlled.

## 2012-08-15 NOTE — Patient Instructions (Signed)
Continue your current therapy  I will see you in 6 months.   

## 2012-12-12 ENCOUNTER — Other Ambulatory Visit: Payer: Self-pay

## 2013-01-23 ENCOUNTER — Other Ambulatory Visit: Payer: Self-pay | Admitting: Gastroenterology

## 2013-02-21 ENCOUNTER — Telehealth: Payer: Self-pay

## 2013-02-21 MED ORDER — AMLODIPINE BESYLATE 5 MG PO TABS: 5.0000 mg | ORAL_TABLET | Freq: Every day | ORAL | Status: DC

## 2013-02-21 NOTE — Telephone Encounter (Signed)
Refill

## 2013-03-14 ENCOUNTER — Other Ambulatory Visit: Payer: Self-pay

## 2013-05-22 ENCOUNTER — Other Ambulatory Visit: Payer: Self-pay | Admitting: *Deleted

## 2013-05-22 MED ORDER — AMLODIPINE BESYLATE 5 MG PO TABS: 5.0000 mg | ORAL_TABLET | Freq: Every day | ORAL | Status: DC

## 2013-07-23 ENCOUNTER — Institutional Professional Consult (permissible substitution): Payer: Medicare Other | Admitting: Neurology

## 2013-09-06 ENCOUNTER — Other Ambulatory Visit: Payer: Self-pay

## 2013-09-06 MED ORDER — AMLODIPINE BESYLATE 5 MG PO TABS: 5.0000 mg | ORAL_TABLET | Freq: Every day | ORAL | Status: DC

## 2013-11-01 ENCOUNTER — Telehealth: Payer: Self-pay | Admitting: Neurology

## 2013-11-01 NOTE — Telephone Encounter (Signed)
Called pt to inform him that he did not have his swab test done in our office and that he would have to call Dr. Pennie Banter office to get results. Pt has not been seen in our office sent 06/10/08 by Dr. Erling Cruz and would need a new referral. I advised the pt that if he has any other problems, questions or concerns to call the office. Pt verbalized understanding.

## 2013-11-01 NOTE — Telephone Encounter (Signed)
Pt called states Dr. Katrine Coho office sent over test results to Dr. Rexene Alberts from a swab test on pt> Please call pt with results. Thanks

## 2013-12-19 ENCOUNTER — Encounter: Payer: Self-pay | Admitting: Neurology

## 2013-12-19 ENCOUNTER — Ambulatory Visit (INDEPENDENT_AMBULATORY_CARE_PROVIDER_SITE_OTHER): Payer: Medicare Other | Admitting: Neurology

## 2013-12-19 VITALS — BP 134/74 | HR 61 | Temp 97.0°F | Ht 69.0 in | Wt 196.5 lb

## 2013-12-19 DIAGNOSIS — I480 Paroxysmal atrial fibrillation: Secondary | ICD-10-CM

## 2013-12-19 DIAGNOSIS — R413 Other amnesia: Secondary | ICD-10-CM

## 2013-12-19 DIAGNOSIS — I4891 Unspecified atrial fibrillation: Secondary | ICD-10-CM

## 2013-12-19 NOTE — Patient Instructions (Signed)
You have complaints of memory loss: memory loss or changes in cognitive function can have many reasons and does not always mean you have dementia. Conditions that can contribute to subjective or objective memory loss include: depression, stress, poor sleep from insomnia or sleep apnea, dehydration, fluctuation in blood sugar values, thyroid or electrolyte dysfunction. Dementia can be causes by stroke, brain atherosclerosis and by Alzheimer's disease or other, more rare and sometimes hereditary causes. We will do some additional testing: brain scan and cognitive testing. We will not start medication as yet. Your memory loss is rather mild and probably in keeping with age related changes or what we call Mild cognitive impairment.

## 2013-12-19 NOTE — Progress Notes (Signed)
Subjective:    Patient ID: Arthur Mcdaniel is a 80 y.o. male.  HPI    Star Age, MD, PhD [facility] [address] [place], [place] [address]  Dear Dr. Shelia Media,   I saw your patient, Arthur Mcdaniel, upon your kind request in my neurologic clinic today for initial consultation of his memory loss. The patient is accompanied by his wife today. As you know, [name] is a very pleasant 79 year old right-handed gentleman with an underlying complex medical history of paroxysmal atrial fibrillation, coronary artery disease, status post stent placement in 1997, gout, hypothyroidism, hyperlipidemia, reflux disease, melanoma, obesity, history of detached retina, who has had memory loss for the past year. He previously had seen Dr. Erling Cruz in our office for headaches and was seen by him in 2010. I reviewed office notes from Dr. Erling Cruz. His symptoms at that time were issues with headache and he was diagnosed with occipital neuralgia which since then has resolved. Currently his memory issues have been ongoing for about a year and he has primarily primarily had issues with forgetfulness, misplacing things, asking the same question over again but there is no history of confusion, delusions, hallucinations, disorientation and he still drives and has had no issues. His wife endorses that he is a good driver. He still recognizes people around him well. He is not very concerned about his memory issues. He has not had any personality changes. He has always been very highly functioning gentleman. He is a retired Chief Financial Officer. There is no family history of memory loss. He is a nonsmoker and does not drink alcohol regularly.  His Past Medical History Is Significant For: Past Medical History  Diagnosis Date  . PAF (paroxysmal atrial fibrillation)   . Ischemic heart disease     Remote stent to LCX in 1997  . Gout   . Hypothyroidism   . Hyperlipidemia     Myalgias with Lipitor   . GERD (gastroesophageal reflux disease)   . Melanoma   . Memory deficit   . Detached retina   . Obese   . Normal nuclear stress test June 2012    His Past Surgical History Is Significant For: Past Surgical History  Procedure Laterality Date  . Coronary stent placement  1997    LCX    His Family History Is Significant For: Family History  Problem Relation Age of Onset  . Heart disease Sister     His Social History Is Significant For: History   Social History  . Marital Status: Married    Spouse Name: [name]    Number of Children: 2  . Years of Education: 18   Occupational History  . [employer]    Social History Main Topics  . Smoking status: Never Smoker   . Smokeless tobacco: Never Used  . Alcohol Use: No  . Drug Use: No  . Sexual Activity: None   Other Topics Concern  . None   Social History Narrative   Patient is right handed and resides with wife in a home    His Allergies Are:  Allergies  Allergen Reactions  . Naproxen   :   His Current Medications Are:  Outpatient Encounter Prescriptions as of 12/19/2013  Medication Sig  . allopurinol (ZYLOPRIM) 300 MG tablet Take 300 mg by mouth daily.    Marland Kitchen amLODipine (NORVASC) 2.5 MG tablet Take 2.5 mg by mouth daily.  Marland Kitchen aspirin 81 MG tablet Take 81 mg by mouth daily.    Marland Kitchen  esomeprazole (NEXIUM) 40 MG capsule Take 40 mg by mouth as needed.    . fluticasone (FLONASE) 50 MCG/ACT nasal spray Place 2 sprays into the nose as needed.    . Levothyroxine Sodium 125 MCG CAPS Take by mouth daily before breakfast.  . nitroGLYCERIN (NITROSTAT) 0.4 MG SL tablet Place 1 tablet (0.4 mg total) under the tongue every 5 (five) minutes as needed.  . simvastatin (ZOCOR) 20 MG tablet Take 20 mg by mouth at bedtime.    . vardenafil (LEVITRA) 20 MG tablet Take 20 mg by mouth daily as needed.    . vitamin B-12 (CYANOCOBALAMIN) 1000 MCG tablet Take 1,000 mcg by mouth daily.    . [DISCONTINUED] amLODipine (NORVASC) 5 MG tablet  Take 1 tablet (5 mg total) by mouth daily.  . [DISCONTINUED] levothyroxine (SYNTHROID, LEVOTHROID) 137 MCG tablet Take 137 mcg by mouth daily.    :  Review of Systems:  Out of a complete 14 point review of systems, all are reviewed and negative with the exception of these symptoms as listed below:   Review of Systems  Neurological:       Memory loss/short term    Objective:  Neurologic Exam  Physical Exam Physical Examination:   Filed Vitals:   12/19/13 0952  BP: 134/74  Pulse: 61  Temp: 97 F (36.1 C)    General Examination: The patient is a very pleasant 80 y.o. male in no acute distress. He is calm and cooperative with the exam. He denies Auditory Hallucinations and Visual Hallucinations. He is well groomed and situated in a chair.   HEENT: Normocephalic, atraumatic, pupils are equal, round and reactive to light and accommodation. Funduscopic exam is normal with sharp disc margins noted. Extraocular tracking shows no saccadic breakdown without nystagmus noted. Hearing is intact. Tympanic membranes are clear bilaterally. Face is symmetric with no facial masking and normal facial sensation. There is no lip, neck or jaw tremor. Neck is not rigid with intact passive ROM. There are no carotid bruits on auscultation. Oropharynx exam reveals mild mouth dryness. No significant airway crowding is noted. Mallampati is class II. Tongue protrudes centrally and palate elevates symmetrically.    Chest: is clear to auscultation without wheezing, rhonchi or crackles noted.  Heart: sounds are regular and normal without murmurs, rubs or gallops noted.   Abdomen: is soft, non-tender and non-distended with normal bowel sounds appreciated on auscultation.  Extremities: There is trace pitting edema in the distal lower extremities bilaterally. Pedal pulses are intact.   Skin: is warm and dry with no trophic changes noted. Age-related changes are noted on the skin.   Musculoskeletal: exam reveals  no obvious joint deformities, tenderness or joint swelling or erythema.   Neurologically:  Mental status: The patient is awake and alert, paying good  attention. He is able to completely provide the history. His wife provides details. He is oriented to: person, place, situation, day of week, month of year and year. His memory, attention, language and knowledge are impaired very mildly. There is no aphasia, agnosia, apraxia or anomia. There is a no significant degree of bradyphrenia. Speech is mildly hypophonic with no dysarthria noted. Mood is congruent and affect is normal.  His MMSE (Mini-Mental state exam) score is 25/30.  CDT (Clock Drawing Test) score is 4/4.  AFT (Animal Fluency Test) score is 17.   Cranial nerves are as described above under HEENT exam. In addition, shoulder shrug is normal with equal shoulder height noted.  Motor exam: Normal bulk,  and strength for age is noted. Tone is not rigid with absence of cogwheeling in the bilateral extremities. There is overall no bradykinesia. There is no drift or rebound. There is no tremor.  Romberg is negative. Reflexes are 1+ in the upper extremities and 1+ in the lower extremities. Toes are downgoing bilaterally. Fine motor skills: Finger taps, hand movements, and rapid alternating patting are mildly impaired bilaterally. Foot taps and foot agility are minimally impaired bilaterally.   Cerebellar testing shows no dysmetria or intention tremor on finger to nose testing. Heel to shin is unremarkable. There is no truncal or gait ataxia.   Sensory exam is intact to light touch, pinprick, vibration, temperature sense and proprioception in the upper and lower extremities.   Gait, station and balance: He stands up from the seated position with no difficulty. No veering to one side is noted. No leaning to one side. Posture is age-appropriate. Stance is narrow-based. He turns walks well and turns in 3 steps. Tandem walk is mildly difficult for him.  Balance is preserved.  Assessment and Plan:   In summary, Lamin Chandley Munyon is a very pleasant 80 y.o.-year old male with an underlying medical history of paroxysmal atrial fibrillation, coronary artery disease, status post stent placement in 1997, gout, hypothyroidism, hyperlipidemia, reflux disease, melanoma, obesity, history of detached retina, who has had memory loss for the past year. His history and physical exam are in keeping with mild memory loss, most likely mild cognitive impairment versus mild dementia. I talked to the patient and his wife at length about his memory issues. I would like to proceed with a brain MRI without contrast and formal neurocognitive testing for further delineation of his memory issues. I would like to hold off on initiating medication just yet. We did talk about potential utilization of a dementia medication in the near future. He's had some blood work at your office in the recent past.   I had a long chat with the patient and his wife about my findings and the diagnosis of memory loss and dementia, its prognosis and treatment options. We talked about maintaining a healthy lifestyle in general and staying active mentally and physically. I encouraged the patient to eat healthy, exercise daily and keep well hydrated, to keep a scheduled bedtime and wake time routine, to not skip any meals and eat healthy snacks in between meals and to have protein with every meal. I stressed the importance of regular exercise, within of course the patient's own mobility limitations. I encouraged the patient to keep up with current events by reading the news paper or watching the news and to do word puzzles, or if feasible, to go on [email or web address].    I answered all their questions today and the patient and his wife were in agreement with the above outlined plan. I would like to see the patient back in 3 months, sooner if the need arises and encouraged them to call with any interim questions,  concerns, problems, updates.  Thank you very much for allowing me to participate in the care of this nice patient. If I can be of any further assistance to you please do not hesitate to call me at [phone].  Sincerely,   Star Age, MD, PhD

## 2013-12-20 ENCOUNTER — Telehealth: Payer: Self-pay | Admitting: Neurology

## 2013-12-20 NOTE — Telephone Encounter (Signed)
Dr. Valentina Shaggy calling to state that he can't take patient's referral since patient has [facility] and they have never approved him. If questions, please call.

## 2013-12-23 ENCOUNTER — Telehealth: Payer: Self-pay | Admitting: Neurology

## 2013-12-23 NOTE — Telephone Encounter (Signed)
Patient's wife [name] calling to state that they never received an AVS after their recent office visit and would like that mailed to them, also, she is wondering if Dr. Rexene Alberts has received information regarding patient's mouth swab results. Please return call to patient's wife and advise.

## 2013-12-24 NOTE — Telephone Encounter (Signed)
Pt's AVS was mailed out to him today. Wife also requesting mouth swab results. Please advise

## 2013-12-30 ENCOUNTER — Other Ambulatory Visit: Payer: Medicare Other

## 2013-12-31 ENCOUNTER — Other Ambulatory Visit: Payer: Self-pay | Admitting: *Deleted

## 2013-12-31 ENCOUNTER — Telehealth: Payer: Self-pay | Admitting: Neurology

## 2013-12-31 DIAGNOSIS — R413 Other amnesia: Secondary | ICD-10-CM

## 2013-12-31 NOTE — Telephone Encounter (Signed)
Faxed referral to [email or web address]

## 2013-12-31 NOTE — Addendum Note (Signed)
Addended byOliver Hum on: 12/31/2013 08:26 AM   Modules accepted: Orders

## 2013-12-31 NOTE — Telephone Encounter (Signed)
I do not have a mouth swab results. Probably best to try to get in touch with primary care physician to get those results and interpretation of those results from Dr. Shelia Media directly. Please advise patient to call PCP for the results.  Star Age, MD, PhD [facility])

## 2013-12-31 NOTE — Telephone Encounter (Signed)
[name] with [facility] @ [phone], has questions regarding referral received today.  Please call and advise.

## 2014-01-01 NOTE — Telephone Encounter (Signed)
Spoke with [name] from [facility] and she said that she called patient to schedule appt. twice  and he did not understand why the testing and did not wish to schedule appt at this time.  Shared message with Dr Rexene Alberts, and she will discuss with patient on next visit. Informed [facility] thru VM message

## 2014-01-01 NOTE — Telephone Encounter (Signed)
Called patient to inform per Dr Guadelupe Sabin message below, concerning mouth swab results. He is to contact his PCP for results

## 2014-01-01 NOTE — Telephone Encounter (Signed)
Called patient to share a message from Dr Rexene Alberts concerning mouth swab results, lt VM message

## 2014-01-14 ENCOUNTER — Ambulatory Visit
Admission: RE | Admit: 2014-01-14 | Discharge: 2014-01-14 | Disposition: A | Discharge status: Home / Self Care | Payer: Medicare Other | Source: Ambulatory Visit | Attending: Neurology | Admitting: Neurology

## 2014-01-14 DIAGNOSIS — R413 Other amnesia: Secondary | ICD-10-CM

## 2014-01-15 ENCOUNTER — Other Ambulatory Visit: Payer: Self-pay

## 2014-01-16 ENCOUNTER — Other Ambulatory Visit: Payer: Self-pay

## 2014-01-17 ENCOUNTER — Other Ambulatory Visit: Payer: Self-pay

## 2014-01-17 NOTE — Addendum Note (Signed)
Addended by: Golden Hurter D on: 01/17/2014 05:44 PM   Modules accepted: Orders

## 2014-01-17 NOTE — Telephone Encounter (Signed)
Spoke to patient Dr.Jordan advised to stay on amlodipine 2.5 mg daily.Refill sent to pharmacy.

## 2014-01-17 NOTE — Telephone Encounter (Signed)
I would leave him on 2.5 mg daily of amlodipine.  Peter Martinique MD, [facility]

## 2014-01-24 ENCOUNTER — Other Ambulatory Visit: Payer: Self-pay

## 2014-01-24 MED ORDER — AMLODIPINE BESYLATE 2.5 MG PO TABS: 2.5000 mg | ORAL_TABLET | Freq: Every day | ORAL | Status: DC

## 2014-02-11 ENCOUNTER — Emergency Department (HOSPITAL_COMMUNITY): Payer: Medicare Other

## 2014-02-11 ENCOUNTER — Encounter (HOSPITAL_COMMUNITY): Payer: Self-pay | Admitting: Emergency Medicine

## 2014-02-11 ENCOUNTER — Inpatient Hospital Stay (HOSPITAL_COMMUNITY)
Admission: EM | Admit: 2014-02-11 | Discharge: 2014-02-16 | DRG: 419 | Disposition: A | Discharge status: Home / Self Care | Payer: Medicare Other | Attending: Internal Medicine | Admitting: Internal Medicine

## 2014-02-11 DIAGNOSIS — E785 Hyperlipidemia, unspecified: Secondary | ICD-10-CM

## 2014-02-11 DIAGNOSIS — R059 Cough, unspecified: Secondary | ICD-10-CM

## 2014-02-11 DIAGNOSIS — Z955 Presence of coronary angioplasty implant and graft: Secondary | ICD-10-CM

## 2014-02-11 DIAGNOSIS — Z7982 Long term (current) use of aspirin: Secondary | ICD-10-CM

## 2014-02-11 DIAGNOSIS — R4189 Other symptoms and signs involving cognitive functions and awareness: Secondary | ICD-10-CM | POA: Diagnosis present

## 2014-02-11 DIAGNOSIS — R05 Cough: Secondary | ICD-10-CM

## 2014-02-11 DIAGNOSIS — K8050 Calculus of bile duct without cholangitis or cholecystitis without obstruction: Secondary | ICD-10-CM | POA: Insufficient documentation

## 2014-02-11 DIAGNOSIS — E119 Type 2 diabetes mellitus without complications: Secondary | ICD-10-CM | POA: Diagnosis present

## 2014-02-11 DIAGNOSIS — E876 Hypokalemia: Secondary | ICD-10-CM | POA: Diagnosis present

## 2014-02-11 DIAGNOSIS — K8040 Calculus of bile duct with cholecystitis, unspecified, without obstruction: Secondary | ICD-10-CM | POA: Diagnosis present

## 2014-02-11 DIAGNOSIS — I2510 Atherosclerotic heart disease of native coronary artery without angina pectoris: Secondary | ICD-10-CM | POA: Diagnosis present

## 2014-02-11 DIAGNOSIS — E039 Hypothyroidism, unspecified: Secondary | ICD-10-CM | POA: Diagnosis present

## 2014-02-11 DIAGNOSIS — I480 Paroxysmal atrial fibrillation: Secondary | ICD-10-CM | POA: Diagnosis present

## 2014-02-11 DIAGNOSIS — I10 Essential (primary) hypertension: Secondary | ICD-10-CM | POA: Diagnosis present

## 2014-02-11 DIAGNOSIS — Z85820 Personal history of malignant melanoma of skin: Secondary | ICD-10-CM

## 2014-02-11 DIAGNOSIS — K8062 Calculus of gallbladder and bile duct with acute cholecystitis without obstruction: Principal | ICD-10-CM | POA: Diagnosis present

## 2014-02-11 DIAGNOSIS — K219 Gastro-esophageal reflux disease without esophagitis: Secondary | ICD-10-CM | POA: Diagnosis present

## 2014-02-11 DIAGNOSIS — M109 Gout, unspecified: Secondary | ICD-10-CM | POA: Diagnosis present

## 2014-02-11 DIAGNOSIS — K810 Acute cholecystitis: Secondary | ICD-10-CM

## 2014-02-11 LAB — URINALYSIS, ROUTINE W REFLEX MICROSCOPIC
Glucose, UA: NEGATIVE mg/dL
HGB URINE DIPSTICK: NEGATIVE
Ketones, ur: 15 mg/dL — AB
NITRITE: POSITIVE — AB
PROTEIN: NEGATIVE mg/dL
SPECIFIC GRAVITY, URINE: 1.028 (ref 1.005–1.030)
UROBILINOGEN UA: 1 mg/dL (ref 0.0–1.0)
pH: 5 (ref 5.0–8.0)

## 2014-02-11 LAB — COMPREHENSIVE METABOLIC PANEL
ALK PHOS: 139 U/L — AB (ref 39–117)
ALT: 69 U/L — AB (ref 0–53)
ANION GAP: 13 (ref 5–15)
AST: 41 U/L — ABNORMAL HIGH (ref 0–37)
Albumin: 3.1 g/dL — ABNORMAL LOW (ref 3.5–5.2)
BILIRUBIN TOTAL: 4.3 mg/dL — AB (ref 0.3–1.2)
BUN: 28 mg/dL — AB (ref 6–23)
CHLORIDE: 97 meq/L (ref 96–112)
CO2: 24 meq/L (ref 19–32)
Calcium: 8.8 mg/dL (ref 8.4–10.5)
Creatinine, Ser: 1.2 mg/dL (ref 0.50–1.35)
GFR, EST AFRICAN AMERICAN: 64 mL/min — AB (ref >90)
GFR, EST NON AFRICAN AMERICAN: 55 mL/min — AB (ref >90)
GLUCOSE: 113 mg/dL — AB (ref 70–99)
POTASSIUM: 4 meq/L (ref 3.7–5.3)
SODIUM: 134 meq/L — AB (ref 137–147)
TOTAL PROTEIN: 6.3 g/dL (ref 6.0–8.3)

## 2014-02-11 LAB — CBC WITH DIFFERENTIAL/PLATELET
Basophils Absolute: 0 10*3/uL (ref 0.0–0.1)
Basophils Relative: 0 % (ref 0–1)
EOS ABS: 0.1 10*3/uL (ref 0.0–0.7)
Eosinophils Relative: 1 % (ref 0–5)
HCT: 36.8 % — ABNORMAL LOW (ref 39.0–52.0)
HEMOGLOBIN: 12.9 g/dL — AB (ref 13.0–17.0)
LYMPHS ABS: 1.6 10*3/uL (ref 0.7–4.0)
LYMPHS PCT: 14 % (ref 12–46)
MCH: 34.1 pg — AB (ref 26.0–34.0)
MCHC: 35.1 g/dL (ref 30.0–36.0)
MCV: 97.4 fL (ref 78.0–100.0)
MONOS PCT: 11 % (ref 3–12)
Monocytes Absolute: 1.2 10*3/uL — ABNORMAL HIGH (ref 0.1–1.0)
NEUTROS ABS: 8 10*3/uL — AB (ref 1.7–7.7)
NEUTROS PCT: 74 % (ref 43–77)
PLATELETS: 145 10*3/uL — AB (ref 150–400)
RBC: 3.78 MIL/uL — AB (ref 4.22–5.81)
RDW: 13.8 % (ref 11.5–15.5)
WBC: 10.9 10*3/uL — AB (ref 4.0–10.5)

## 2014-02-11 LAB — URINE MICROSCOPIC-ADD ON

## 2014-02-11 LAB — GLUCOSE, CAPILLARY: GLUCOSE-CAPILLARY: 143 mg/dL — AB (ref 70–99)

## 2014-02-11 LAB — LIPASE, BLOOD: Lipase: 10 U/L — ABNORMAL LOW (ref 11–59)

## 2014-02-11 MED ORDER — AMLODIPINE BESYLATE 2.5 MG PO TABS
2.5000 mg | ORAL_TABLET | Freq: Every day | ORAL | Status: DC
  Filled 2014-02-11: qty 1

## 2014-02-11 MED ORDER — ASPIRIN 81 MG PO TABS
81.0000 mg | ORAL_TABLET | Freq: Every day | ORAL | Status: DC
Start: 2014-02-11 — End: 2014-02-11

## 2014-02-11 MED ORDER — SIMVASTATIN 20 MG PO TABS
20.0000 mg | ORAL_TABLET | Freq: Once | ORAL | Status: AC
  Administered 2014-02-11: 20 mg via ORAL
  Filled 2014-02-11: qty 1

## 2014-02-11 MED ORDER — ALLOPURINOL 300 MG PO TABS
300.0000 mg | ORAL_TABLET | Freq: Every day | ORAL | Status: DC
  Administered 2014-02-12 – 2014-02-16 (×5): 300 mg via ORAL
  Filled 2014-02-11 (×6): qty 1

## 2014-02-11 MED ORDER — ONDANSETRON HCL 4 MG PO TABS: 4.0000 mg | ORAL_TABLET | Freq: Four times a day (QID) | ORAL | Status: DC | PRN

## 2014-02-11 MED ORDER — NITROGLYCERIN 0.4 MG SL SUBL: 0.4000 mg | SUBLINGUAL_TABLET | SUBLINGUAL | Status: DC | PRN

## 2014-02-11 MED ORDER — ONDANSETRON 8 MG/NS 50 ML IVPB
8.0000 mg | Freq: Four times a day (QID) | INTRAVENOUS | Status: DC | PRN
  Filled 2014-02-11: qty 8

## 2014-02-11 MED ORDER — IOHEXOL 300 MG/ML  SOLN
25.0000 mL | Freq: Once | INTRAMUSCULAR | Status: AC | PRN
  Administered 2014-02-11: 25 mL via ORAL

## 2014-02-11 MED ORDER — HEPARIN SODIUM (PORCINE) 5000 UNIT/ML IJ SOLN
5000.0000 [IU] | Freq: Three times a day (TID) | INTRAMUSCULAR | Status: DC
Start: 2014-02-11 — End: 2014-02-13
  Administered 2014-02-11 – 2014-02-13 (×5): 5000 [IU] via SUBCUTANEOUS
  Filled 2014-02-11 (×7): qty 1

## 2014-02-11 MED ORDER — METFORMIN HCL 850 MG PO TABS: 850.0000 mg | ORAL_TABLET | Freq: Once | ORAL | Status: DC

## 2014-02-11 MED ORDER — INSULIN ASPART 100 UNIT/ML ~~LOC~~ SOLN
0.0000 [IU] | Freq: Four times a day (QID) | SUBCUTANEOUS | Status: DC
  Administered 2014-02-12 – 2014-02-15 (×10): 1 [IU] via SUBCUTANEOUS

## 2014-02-11 MED ORDER — SODIUM CHLORIDE 0.9 % IV BOLUS (SEPSIS)
1000.0000 mL | Freq: Once | INTRAVENOUS | Status: AC
  Administered 2014-02-11: 1000 mL via INTRAVENOUS

## 2014-02-11 MED ORDER — SODIUM CHLORIDE 0.9 % IV SOLN
INTRAVENOUS | Status: DC
  Administered 2014-02-11: 23:00:00 via INTRAVENOUS

## 2014-02-11 MED ORDER — PANTOPRAZOLE SODIUM 40 MG PO TBEC
40.0000 mg | DELAYED_RELEASE_TABLET | Freq: Every day | ORAL | Status: DC
  Administered 2014-02-12 – 2014-02-16 (×5): 40 mg via ORAL
  Filled 2014-02-11 (×4): qty 1

## 2014-02-11 MED ORDER — ASPIRIN EC 81 MG PO TBEC
81.0000 mg | DELAYED_RELEASE_TABLET | Freq: Every day | ORAL | Status: DC
  Administered 2014-02-12 – 2014-02-16 (×5): 81 mg via ORAL
  Filled 2014-02-11 (×6): qty 1

## 2014-02-11 MED ORDER — IOHEXOL 300 MG/ML  SOLN
80.0000 mL | Freq: Once | INTRAMUSCULAR | Status: AC | PRN
  Administered 2014-02-11: 80 mL via INTRAVENOUS

## 2014-02-11 MED ORDER — LEVOTHYROXINE SODIUM 125 MCG PO TABS
125.0000 ug | ORAL_TABLET | Freq: Every day | ORAL | Status: DC
  Administered 2014-02-12 – 2014-02-16 (×5): 125 ug via ORAL
  Filled 2014-02-11 (×8): qty 1

## 2014-02-11 MED ORDER — ASPIRIN 81 MG PO CHEW: 81.0000 mg | CHEWABLE_TABLET | Freq: Once | ORAL | Status: DC

## 2014-02-11 MED ORDER — LEVOTHYROXINE SODIUM 125 MCG PO CAPS: ORAL_CAPSULE | Freq: Every day | ORAL | Status: DC

## 2014-02-11 MED ORDER — SODIUM CHLORIDE 0.9 % IV SOLN: INTRAVENOUS | Status: DC

## 2014-02-11 NOTE — ED Provider Notes (Signed)
CSN: [id number]     Arrival date & time 02/11/14  1403 History   First MD Initiated Contact with Patient 02/11/14 1559     Chief Complaint  Patient presents with  . Abnormal Lab    WBC up     (Consider location/radiation/quality/duration/timing/severity/associated sxs/prior Treatment) The history is provided by the patient.  Arthur Mcdaniel is a 80 y.o. male hx of DM, paroxysmal afib here with epigastric pain, nausea, vomiting. Patient has been vomiting 2 days ago. Vomiting resolved now has nausea. Mild epigastric pain as well. Went to PMD office, was found to have WBC 15, bilirubin in the urine. Sent for r/o appy vs chole.    Past Medical History  Diagnosis Date  . PAF (paroxysmal atrial fibrillation)   . Ischemic heart disease     Remote stent to LCX in 1997  . Gout   . Hypothyroidism   . Hyperlipidemia     Myalgias with Lipitor  . GERD (gastroesophageal reflux disease)   . Melanoma   . Memory deficit   . Detached retina   . Obese   . Normal nuclear stress test June 2012   Past Surgical History  Procedure Laterality Date  . Coronary stent placement  1997    LCX   Family History  Problem Relation Age of Onset  . Heart disease Sister    History  Substance Use Topics  . Smoking status: Never Smoker   . Smokeless tobacco: Never Used  . Alcohol Use: No    Review of Systems  Gastrointestinal: Positive for nausea, vomiting and abdominal pain.  All other systems reviewed and are negative.     Allergies  Naproxen  Home Medications   Prior to Admission medications   Medication Sig Start Date End Date Taking? Authorizing Provider  allopurinol (ZYLOPRIM) 300 MG tablet Take 300 mg by mouth daily.     Yes Historical Provider, MD  amLODipine (NORVASC) 2.5 MG tablet Take 1 tablet (2.5 mg total) by mouth daily. 01/24/14  Yes Peter M Martinique, MD  aspirin 81 MG tablet Take 81 mg by mouth daily.     Yes Historical Provider, MD  cycloSPORINE (RESTASIS) 0.05 % ophthalmic  emulsion Place 1 drop into both eyes 2 (two) times daily.   Yes Historical Provider, MD  esomeprazole (NEXIUM) 40 MG capsule Take 40 mg by mouth as needed.     Yes Historical Provider, MD  fluticasone (FLONASE) 50 MCG/ACT nasal spray Place 2 sprays into the nose as needed.     Yes Historical Provider, MD  Levothyroxine Sodium 125 MCG CAPS Take by mouth daily before breakfast.   Yes Historical Provider, MD  metFORMIN (GLUCOPHAGE) 850 MG tablet Take 850 mg by mouth 2 (two) times daily with a meal.   Yes Historical Provider, MD  simvastatin (ZOCOR) 20 MG tablet Take 20 mg by mouth at bedtime.     Yes Historical Provider, MD  vardenafil (LEVITRA) 20 MG tablet Take 20 mg by mouth daily as needed.     Yes Historical Provider, MD  vitamin B-12 (CYANOCOBALAMIN) 1000 MCG tablet Take 1,000 mcg by mouth daily.     Yes Historical Provider, MD  nitroGLYCERIN (NITROSTAT) 0.4 MG SL tablet Place 1 tablet (0.4 mg total) under the tongue every 5 (five) minutes as needed. 01/20/11   Burtis Junes, NP   BP 158/68  Pulse 76  Temp(Src) 97.5 F (36.4 C) (Oral)  Resp 20  Ht 5' 11"  (1.803 m)  Wt 184 lb (83.462  kg)  BMI 25.67 kg/m2  SpO2 99% Physical Exam  Nursing note and vitals reviewed. Constitutional: He is oriented to person, place, and time. He appears well-developed and well-nourished.  HENT:  Head: Normocephalic.  MM slightly dry   Eyes: Conjunctivae are normal. Pupils are equal, round, and reactive to light.  Neck: Normal range of motion. Neck supple.  Cardiovascular: Normal rate, regular rhythm and normal heart sounds.   Pulmonary/Chest: Effort normal and breath sounds normal. No respiratory distress. He has no wheezes. He has no rales.  Abdominal: Soft. Bowel sounds are normal.  Minimal epigastric tenderness, no RUQ tenderness, no murphy's   Musculoskeletal: Normal range of motion.  Neurological: He is alert and oriented to person, place, and time.  Skin: Skin is warm and dry.  Psychiatric: He  has a normal mood and affect. His behavior is normal. Judgment and thought content normal.    ED Course  Procedures (including critical care time) Labs Review Labs Reviewed  CBC WITH DIFFERENTIAL - Abnormal; Notable for the following:    WBC 10.9 (*)    RBC 3.78 (*)    Hemoglobin 12.9 (*)    HCT 36.8 (*)    MCH 34.1 (*)    Platelets 145 (*)    Neutro Abs 8.0 (*)    Monocytes Absolute 1.2 (*)    All other components within normal limits  COMPREHENSIVE METABOLIC PANEL - Abnormal; Notable for the following:    Sodium 134 (*)    Glucose, Bld 113 (*)    BUN 28 (*)    Albumin 3.1 (*)    AST 41 (*)    ALT 69 (*)    Alkaline Phosphatase 139 (*)    Total Bilirubin 4.3 (*)    GFR calc non Af Amer 55 (*)    GFR calc Af Amer 64 (*)    All other components within normal limits  LIPASE, BLOOD - Abnormal; Notable for the following:    Lipase 10 (*)    All other components within normal limits  URINALYSIS, ROUTINE W REFLEX MICROSCOPIC - Abnormal; Notable for the following:    Color, Urine AMBER (*)    Bilirubin Urine MODERATE (*)    Ketones, ur 15 (*)    Nitrite POSITIVE (*)    Leukocytes, UA SMALL (*)    All other components within normal limits  URINE MICROSCOPIC-ADD ON - Abnormal; Notable for the following:    Squamous Epithelial / LPF FEW (*)    Bacteria, UA FEW (*)    Casts HYALINE CASTS (*)    All other components within normal limits    Imaging Review Ct Abdomen Pelvis W Contrast  02/11/2014   CLINICAL DATA:  Initial evaluation of 80 year old male presenting with nausea and diffuse abdominal pain for the past several days. Intermittent vomiting. White blood cell count mildly elevated.  EXAM: CT ABDOMEN AND PELVIS WITH CONTRAST  TECHNIQUE: Multidetector CT imaging of the abdomen and pelvis was performed using the standard protocol following bolus administration of intravenous contrast.  CONTRAST:  5m OMNIPAQUE IOHEXOL 300 MG/ML  SOLN  COMPARISON:  No priors.  FINDINGS: Lower  chest: Mild scarring in the lung bases bilaterally. Mild cardiomegaly. Atherosclerotic calcifications in the left anterior descending, left circumflex and right coronary arteries. Small hiatal hernia.  Hepatobiliary: Gallbladder appears moderately distended, however, the gallbladder wall appears diffusely thickened measuring up to 7 mm, and there is slight haziness in the surrounding fat, suggesting inflammation. No definite calcified gallstones are identified. There does  appear to be some intermediate attenuation material layering dependently in the gallbladder, which could indicate the presence of biliary sludge. The appearance of the liver is normal. No intra or extrahepatic biliary ductal dilatation.  Pancreas: Mild fatty atrophy, but otherwise normal in appearance.  Spleen: Unremarkable.  Adrenals/Urinary Tract: Normal appearance of the adrenal glands bilaterally. The kidneys are also normal in appearance bilaterally. Mild bilateral perinephric stranding (nonspecific). No hydroureteronephrosis. Urinary bladder is normal in appearance.  Stomach/Bowel: The appearance of the stomach is normal. No pathologic dilatation of the small bowel or colon. Numerous colonic diverticulae are noted, most pronounced in the distal descending colon and sigmoid colon, without surrounding inflammatory changes to suggest an acute diverticulitis at this time. Normal appendix. Duodenal diverticulum from the second portion of the duodenum in the region of the pancreatic head incidentally noted.  Vascular/Lymphatic: Extensive atherosclerosis throughout the abdominal and pelvic vasculature, without evidence of aneurysm or dissection. No pathologically enlarged lymph nodes are identified in the abdomen or pelvis. Numerous reactive size retroperitoneal lymph nodes are incidentally noted, but nonspecific  Reproductive: Prostate gland is unremarkable in appearance.  Other: No significant volume of ascites.  No pneumoperitoneum.   Musculoskeletal: Multilevel degenerative disc disease, most severe at L2-L3 and L5-S1. Bilateral pars defects at L5 with 9 mm of anterolisthesis of L5 upon S1. There are no aggressive appearing lytic or blastic lesions noted in the visualized portions of the skeleton.  IMPRESSION: 1. Findings, as above, concerning for acute cholecystitis, likely related to the presence of biliary sludge in the gallbladder. Surgical consultation is recommended. 2. Colonic diverticulosis without findings to suggest acute diverticulitis at this time. 3. Normal appendix. 4. Extensive atherosclerosis, including at least 3 vessel coronary artery disease. 5. Small hiatal hernia. 6. Additional incidental findings, as above. These results were called by telephone at the time of interpretation on 02/11/2014 at 5:56 pm to Dr. Shirlyn Goltz, who verbally acknowledged these results.   Electronically Signed   By: Vinnie Langton M.D.   On: 02/11/2014 17:57     EKG Interpretation None      MDM   Final diagnoses:  None    Frankie Scipio Axel is a 80 y.o. male here with nausea, epigastric pain. Will repeat labs, UA. Will get CT ab/pel to assess.   6:40 PM CT showed acute chole. LFTs and ALk phos elevated. Bili 4. I called surgery, Dr. Ninfa Linden, who recommend GI consult. I attempt to call [facility], talked to Dr. Amedeo Plenty, who will see patient. Will admit to medicine for possible choledocholithiasis.     Wandra Arthurs, MD 02/11/14 [phone]

## 2014-02-11 NOTE — Consult Note (Signed)
Reason for Consult:cholecystitis with possible CBD stone Referring Physician: Dr. Arnoldo Hooker is an 80 y.o. male.  HPI: I have been asked to evaluate this patient for the above complaint. He presents with a several-day history of right upper quadrant abdominal pain. He has had nausea and one episode of emesis. He reports now that his pain is minimal. He described the pain initially as a mild ache that then sharpened. It was moderate in intensity. It did not refer her anywhere else. It is difficult to tell whether he has had similar attacks in the past. He is vague when discussing this. He denied hematemesis. Bowel movements are normal.  Past Medical History  Diagnosis Date  . PAF (paroxysmal atrial fibrillation)   . Ischemic heart disease     Remote stent to LCX in 1997  . Gout   . Hypothyroidism   . Hyperlipidemia     Myalgias with Lipitor  . GERD (gastroesophageal reflux disease)   . Melanoma   . Memory deficit   . Detached retina   . Obese   . Normal nuclear stress test June 2012    Past Surgical History  Procedure Laterality Date  . Coronary stent placement  1997    LCX    Family History  Problem Relation Age of Onset  . Heart disease Sister     Social History:  reports that he has never smoked. He has never used smokeless tobacco. He reports that he does not drink alcohol or use illicit drugs.  Allergies:  Allergies  Allergen Reactions  . Naproxen Other (See Comments)    Doesn't remember     Medications: I have reviewed the patient's current medications.  Results for orders placed during the hospital encounter of 02/11/14 (from the past 48 hour(s))  CBC WITH DIFFERENTIAL     Status: Abnormal   Collection Time    02/11/14  4:15 PM      Result Value Ref Range   WBC 10.9 (*) 4.0 - 10.5 K/uL   RBC 3.78 (*) 4.22 - 5.81 MIL/uL   Hemoglobin 12.9 (*) 13.0 - 17.0 g/dL   HCT 36.8 (*) 39.0 - 52.0 %   MCV 97.4  78.0 - 100.0 fL   MCH 34.1 (*) 26.0 - 34.0 pg   MCHC 35.1  30.0 - 36.0 g/dL   RDW 13.8  11.5 - 15.5 %   Platelets 145 (*) 150 - 400 K/uL   Neutrophils Relative % 74  43 - 77 %   Neutro Abs 8.0 (*) 1.7 - 7.7 K/uL   Lymphocytes Relative 14  12 - 46 %   Lymphs Abs 1.6  0.7 - 4.0 K/uL   Monocytes Relative 11  3 - 12 %   Monocytes Absolute 1.2 (*) 0.1 - 1.0 K/uL   Eosinophils Relative 1  0 - 5 %   Eosinophils Absolute 0.1  0.0 - 0.7 K/uL   Basophils Relative 0  0 - 1 %   Basophils Absolute 0.0  0.0 - 0.1 K/uL  COMPREHENSIVE METABOLIC PANEL     Status: Abnormal   Collection Time    02/11/14  4:15 PM      Result Value Ref Range   Sodium 134 (*) 137 - 147 mEq/L   Potassium 4.0  3.7 - 5.3 mEq/L   Chloride 97  96 - 112 mEq/L   CO2 24  19 - 32 mEq/L   Glucose, Bld 113 (*) 70 - 99 mg/dL   BUN 28 (*)  6 - 23 mg/dL   Creatinine, Ser 1.20  0.50 - 1.35 mg/dL   Calcium 8.8  8.4 - 10.5 mg/dL   Total Protein 6.3  6.0 - 8.3 g/dL   Albumin 3.1 (*) 3.5 - 5.2 g/dL   AST 41 (*) 0 - 37 U/L   ALT 69 (*) 0 - 53 U/L   Alkaline Phosphatase 139 (*) 39 - 117 U/L   Total Bilirubin 4.3 (*) 0.3 - 1.2 mg/dL   GFR calc non Af Amer 55 (*) >90 mL/min   GFR calc Af Amer 64 (*) >90 mL/min   Comment: (NOTE)     The eGFR has been calculated using the CKD EPI equation.     This calculation has not been validated in all clinical situations.     eGFR's persistently <90 mL/min signify possible Chronic Kidney     Disease.   Anion gap 13  5 - 15  LIPASE, BLOOD     Status: Abnormal   Collection Time    02/11/14  4:15 PM      Result Value Ref Range   Lipase 10 (*) 11 - 59 U/L  URINALYSIS, ROUTINE W REFLEX MICROSCOPIC     Status: Abnormal   Collection Time    02/11/14  4:27 PM      Result Value Ref Range   Color, Urine AMBER (*) YELLOW   Comment: BIOCHEMICALS MAY BE AFFECTED BY COLOR   APPearance CLEAR  CLEAR   Specific Gravity, Urine 1.028  1.005 - 1.030   pH 5.0  5.0 - 8.0   Glucose, UA NEGATIVE  NEGATIVE mg/dL   Hgb urine dipstick NEGATIVE  NEGATIVE    Bilirubin Urine MODERATE (*) NEGATIVE   Ketones, ur 15 (*) NEGATIVE mg/dL   Protein, ur NEGATIVE  NEGATIVE mg/dL   Urobilinogen, UA 1.0  0.0 - 1.0 mg/dL   Nitrite POSITIVE (*) NEGATIVE   Leukocytes, UA SMALL (*) NEGATIVE  URINE MICROSCOPIC-ADD ON     Status: Abnormal   Collection Time    02/11/14  4:27 PM      Result Value Ref Range   Squamous Epithelial / LPF FEW (*) RARE   WBC, UA 0-2  <3 WBC/hpf   Bacteria, UA FEW (*) RARE   Casts HYALINE CASTS (*) NEGATIVE   Urine-Other MUCOUS PRESENT      Ct Abdomen Pelvis W Contrast  02/11/2014   CLINICAL DATA:  Initial evaluation of 80 year old male presenting with nausea and diffuse abdominal pain for the past several days. Intermittent vomiting. White blood cell count mildly elevated.  EXAM: CT ABDOMEN AND PELVIS WITH CONTRAST  TECHNIQUE: Multidetector CT imaging of the abdomen and pelvis was performed using the standard protocol following bolus administration of intravenous contrast.  CONTRAST:  61m OMNIPAQUE IOHEXOL 300 MG/ML  SOLN  COMPARISON:  No priors.  FINDINGS: Lower chest: Mild scarring in the lung bases bilaterally. Mild cardiomegaly. Atherosclerotic calcifications in the left anterior descending, left circumflex and right coronary arteries. Small hiatal hernia.  Hepatobiliary: Gallbladder appears moderately distended, however, the gallbladder wall appears diffusely thickened measuring up to 7 mm, and there is slight haziness in the surrounding fat, suggesting inflammation. No definite calcified gallstones are identified. There does appear to be some intermediate attenuation material layering dependently in the gallbladder, which could indicate the presence of biliary sludge. The appearance of the liver is normal. No intra or extrahepatic biliary ductal dilatation.  Pancreas: Mild fatty atrophy, but otherwise normal in appearance.  Spleen: Unremarkable.  Adrenals/Urinary  Tract: Normal appearance of the adrenal glands bilaterally. The kidneys are  also normal in appearance bilaterally. Mild bilateral perinephric stranding (nonspecific). No hydroureteronephrosis. Urinary bladder is normal in appearance.  Stomach/Bowel: The appearance of the stomach is normal. No pathologic dilatation of the small bowel or colon. Numerous colonic diverticulae are noted, most pronounced in the distal descending colon and sigmoid colon, without surrounding inflammatory changes to suggest an acute diverticulitis at this time. Normal appendix. Duodenal diverticulum from the second portion of the duodenum in the region of the pancreatic head incidentally noted.  Vascular/Lymphatic: Extensive atherosclerosis throughout the abdominal and pelvic vasculature, without evidence of aneurysm or dissection. No pathologically enlarged lymph nodes are identified in the abdomen or pelvis. Numerous reactive size retroperitoneal lymph nodes are incidentally noted, but nonspecific  Reproductive: Prostate gland is unremarkable in appearance.  Other: No significant volume of ascites.  No pneumoperitoneum.  Musculoskeletal: Multilevel degenerative disc disease, most severe at L2-L3 and L5-S1. Bilateral pars defects at L5 with 9 mm of anterolisthesis of L5 upon S1. There are no aggressive appearing lytic or blastic lesions noted in the visualized portions of the skeleton.  IMPRESSION: 1. Findings, as above, concerning for acute cholecystitis, likely related to the presence of biliary sludge in the gallbladder. Surgical consultation is recommended. 2. Colonic diverticulosis without findings to suggest acute diverticulitis at this time. 3. Normal appendix. 4. Extensive atherosclerosis, including at least 3 vessel coronary artery disease. 5. Small hiatal hernia. 6. Additional incidental findings, as above. These results were called by telephone at the time of interpretation on 02/11/2014 at 5:56 pm to Dr. Shirlyn Goltz, who verbally acknowledged these results.   Electronically Signed   By: Vinnie Langton  M.D.   On: 02/11/2014 17:57    Review of Systems  All other systems reviewed and are negative.  Blood pressure 146/87, pulse 76, temperature 97.5 F (36.4 C), temperature source Oral, resp. rate 25, height 5' 11"  (1.803 m), weight 184 lb (83.462 kg), SpO2 99.00%. Physical Exam  Constitutional: He is oriented to person, place, and time. He appears well-developed and well-nourished. No distress.  jaundiced  HENT:  Head: Normocephalic and atraumatic.  Right Ear: External ear normal.  Left Ear: External ear normal.  Nose: Nose normal.  Mouth/Throat: Oropharynx is clear and moist. No oropharyngeal exudate.  Eyes: Pupils are equal, round, and reactive to light. Right eye exhibits no discharge. Left eye exhibits no discharge. Scleral icterus is present.  Neck: Normal range of motion. Neck supple. No tracheal deviation present.  Cardiovascular: Normal rate, regular rhythm, normal heart sounds and intact distal pulses.   No murmur heard. Respiratory: Breath sounds normal. No respiratory distress. He has no wheezes.  GI: Soft. He exhibits no distension. There is tenderness. There is guarding.  Mild tenderness and guarding to deep palpation in the right upper quadrant  Musculoskeletal: Normal range of motion. He exhibits no edema and no tenderness.  Lymphadenopathy:    He has no cervical adenopathy.  Neurological: He is alert and oriented to person, place, and time.  Skin: Skin is warm.  Psychiatric: His behavior is normal. Judgment normal.    Assessment/Plan: Cholecystitis with possible choledocholithiasis  He is currently jaundiced in appearance and has a bilirubin of 4. He needs medical admission and GI consultation. He may very well need an ERCP versus at least an MRCP before considering laparoscopic cholecystectomy unless his LFTs improved dramatically. He is somewhat reluctant to undergo surgery but I explained to him the reasoning for our recommendation  of an eventual laparoscopic  cholecystectomy. We will follow him with you.  Arthur Mcdaniel A 02/11/2014, 8:21 PM

## 2014-02-11 NOTE — ED Notes (Signed)
Pt here from MD office. Pt denies any pain. Over the weekend was vomiting. Pt has not been having any lower belly pain or upper abd pain. MD concerned for elevated white count of 15.6 that was drawn this morning. MD worried about appy or choley.

## 2014-02-11 NOTE — Progress Notes (Signed)
Called for report. Spoke with Sharyn Lull.  Unable to reach [place].

## 2014-02-11 NOTE — ED Notes (Signed)
To ct

## 2014-02-11 NOTE — H&P (Signed)
[facility] History and Physical  Patient: Arthur Mcdaniel  TFT:[id number]  DOB: [birth year]  DOS: the patient was seen and examined on 02/11/2014 PCP: Horatio Pel, MD  Chief Complaint: Abnormal lab  HPI: Arthur Mcdaniel is a 80 y.o. male with Past medical history of A. fib, coronary artery disease, hypothyroidism, GERD, gout in the past. The patient presents with complaints of abnormal lab. He initially presented to his PCP for the complaint of nausea, and vomiting and decreased oral appetite that has been ongoing since last few days. He also was complaining of right lower quadrant pain. His PCP obtained initial workup and was found to have elevated LFT and therefore he was referred here for further workup. He denies any fever or chills denies any diarrhea or constipation. Denies any pain in his abdomen with fatty meal. He denies any changes in his medications.  The patient is coming from home. And at his baseline independent for most of his ADL.  Review of Systems: as mentioned in the history of present illness.  A Comprehensive review of the other systems is negative.  Past Medical History  Diagnosis Date  . PAF (paroxysmal atrial fibrillation)   . Ischemic heart disease     Remote stent to LCX in 1997  . Gout   . Hypothyroidism   . Hyperlipidemia     Myalgias with Lipitor  . GERD (gastroesophageal reflux disease)   . Melanoma   . Memory deficit   . Detached retina   . Obese   . Normal nuclear stress test June 2012   Past Surgical History  Procedure Laterality Date  . Coronary stent placement  1997    LCX   Social History:  reports that he has never smoked. He has never used smokeless tobacco. He reports that he does not drink alcohol or use illicit drugs.  Allergies  Allergen Reactions  . Naproxen Other (See Comments)    Doesn't remember     Family History  Problem Relation Age of Onset  . Heart disease Sister     Prior to Admission  medications   Medication Sig Start Date End Date Taking? Authorizing Provider  allopurinol (ZYLOPRIM) 300 MG tablet Take 300 mg by mouth daily.     Yes Historical Provider, MD  amLODipine (NORVASC) 2.5 MG tablet Take 1 tablet (2.5 mg total) by mouth daily. 01/24/14  Yes Peter M Martinique, MD  aspirin 81 MG tablet Take 81 mg by mouth daily.     Yes Historical Provider, MD  cycloSPORINE (RESTASIS) 0.05 % ophthalmic emulsion Place 1 drop into both eyes 2 (two) times daily.   Yes Historical Provider, MD  esomeprazole (NEXIUM) 40 MG capsule Take 40 mg by mouth as needed.     Yes Historical Provider, MD  fluticasone (FLONASE) 50 MCG/ACT nasal spray Place 2 sprays into the nose as needed.     Yes Historical Provider, MD  Levothyroxine Sodium 125 MCG CAPS Take by mouth daily before breakfast.   Yes Historical Provider, MD  metFORMIN (GLUCOPHAGE) 850 MG tablet Take 850 mg by mouth 2 (two) times daily with a meal.   Yes Historical Provider, MD  simvastatin (ZOCOR) 20 MG tablet Take 20 mg by mouth at bedtime.     Yes Historical Provider, MD  vardenafil (LEVITRA) 20 MG tablet Take 20 mg by mouth daily as needed.     Yes Historical Provider, MD  vitamin B-12 (CYANOCOBALAMIN) 1000 MCG tablet Take 1,000 mcg by mouth daily.  Yes Historical Provider, MD  nitroGLYCERIN (NITROSTAT) 0.4 MG SL tablet Place 1 tablet (0.4 mg total) under the tongue every 5 (five) minutes as needed. 01/20/11   Burtis Junes, NP    Physical Exam: Filed Vitals:   02/11/14 1748 02/11/14 1800 02/11/14 1815 02/11/14 2114  BP: 158/68 146/87  138/93  Pulse: 76 78 76 74  Temp: 97.5 F (36.4 C)   97.9 F (36.6 C)  TempSrc: Oral   Oral  Resp: 20  25 18   Height:    5\' 11"  (1.803 m)  Weight:    86.501 kg (190 lb 11.2 oz)  SpO2: 99% 97% 99% 98%    General: Alert, Awake and Oriented to Time, Place and Person. Appear in mild distress, patient appears forgetful as he does not remember his medications nor the discussion that happen in the ER  with surgery  Eyes: PERRL ENT: Oral Mucosa clear moist. Neck:  no  JVD Cardiovascular: S1 and S2 Present,  aortic systolic  Murmur, Peripheral Pulses Present Respiratory: Bilateral Air entry equal and Decreased, Clear to Auscultation,  no Crackles,  no  wheezes Abdomen: Bowel Sound  present , Soft and  mild right lower quadrant tender Skin:  no  Rash Extremities:  no  Pedal edema,  no  calf tenderness Neurologic: Grossly no focal neuro deficit.other than mild cognitive decline .  Labs on Admission:  CBC:  Recent Labs Lab 02/11/14 1615  WBC 10.9*  NEUTROABS 8.0*  HGB 12.9*  HCT 36.8*  MCV 97.4  PLT 145*    CMP     Component Value Date/Time   NA 134* 02/11/2014 1615   K 4.0 02/11/2014 1615   CL 97 02/11/2014 1615   CO2 24 02/11/2014 1615   GLUCOSE 113* 02/11/2014 1615   BUN 28* 02/11/2014 1615   CREATININE 1.20 02/11/2014 1615   CALCIUM 8.8 02/11/2014 1615   PROT 6.3 02/11/2014 1615   ALBUMIN 3.1* 02/11/2014 1615   AST 41* 02/11/2014 1615   ALT 69* 02/11/2014 1615   ALKPHOS 139* 02/11/2014 1615   BILITOT 4.3* 02/11/2014 1615   GFRNONAA 55* 02/11/2014 1615   GFRAA 64* 02/11/2014 1615     Recent Labs Lab 02/11/14 1615  LIPASE 10*   No results found for this basename: AMMONIA,  in the last 168 hours  No results found for this basename: CKTOTAL, CKMB, CKMBINDEX, TROPONINI,  in the last 168 hours BNP (last 3 results) No results found for this basename: PROBNP,  in the last 8760 hours  Radiological Exams on Admission: Ct Abdomen Pelvis W Contrast  02/11/2014   CLINICAL DATA:  Initial evaluation of 80 year old male presenting with nausea and diffuse abdominal pain for the past several days. Intermittent vomiting. White blood cell count mildly elevated.  EXAM: CT ABDOMEN AND PELVIS WITH CONTRAST  TECHNIQUE: Multidetector CT imaging of the abdomen and pelvis was performed using the standard protocol following bolus administration of intravenous contrast.  CONTRAST:  45mL OMNIPAQUE  IOHEXOL 300 MG/ML  SOLN  COMPARISON:  No priors.  FINDINGS: Lower chest: Mild scarring in the lung bases bilaterally. Mild cardiomegaly. Atherosclerotic calcifications in the left anterior descending, left circumflex and right coronary arteries. Small hiatal hernia.  Hepatobiliary: Gallbladder appears moderately distended, however, the gallbladder wall appears diffusely thickened measuring up to 7 mm, and there is slight haziness in the surrounding fat, suggesting inflammation. No definite calcified gallstones are identified. There does appear to be some intermediate attenuation material layering dependently in the gallbladder, which could  indicate the presence of biliary sludge. The appearance of the liver is normal. No intra or extrahepatic biliary ductal dilatation.  Pancreas: Mild fatty atrophy, but otherwise normal in appearance.  Spleen: Unremarkable.  Adrenals/Urinary Tract: Normal appearance of the adrenal glands bilaterally. The kidneys are also normal in appearance bilaterally. Mild bilateral perinephric stranding (nonspecific). No hydroureteronephrosis. Urinary bladder is normal in appearance.  Stomach/Bowel: The appearance of the stomach is normal. No pathologic dilatation of the small bowel or colon. Numerous colonic diverticulae are noted, most pronounced in the distal descending colon and sigmoid colon, without surrounding inflammatory changes to suggest an acute diverticulitis at this time. Normal appendix. Duodenal diverticulum from the second portion of the duodenum in the region of the pancreatic head incidentally noted.  Vascular/Lymphatic: Extensive atherosclerosis throughout the abdominal and pelvic vasculature, without evidence of aneurysm or dissection. No pathologically enlarged lymph nodes are identified in the abdomen or pelvis. Numerous reactive size retroperitoneal lymph nodes are incidentally noted, but nonspecific  Reproductive: Prostate gland is unremarkable in appearance.  Other: No  significant volume of ascites.  No pneumoperitoneum.  Musculoskeletal: Multilevel degenerative disc disease, most severe at L2-L3 and L5-S1. Bilateral pars defects at L5 with 9 mm of anterolisthesis of L5 upon S1. There are no aggressive appearing lytic or blastic lesions noted in the visualized portions of the skeleton.  IMPRESSION: 1. Findings, as above, concerning for acute cholecystitis, likely related to the presence of biliary sludge in the gallbladder. Surgical consultation is recommended. 2. Colonic diverticulosis without findings to suggest acute diverticulitis at this time. 3. Normal appendix. 4. Extensive atherosclerosis, including at least 3 vessel coronary artery disease. 5. Small hiatal hernia. 6. Additional incidental findings, as above. These results were called by telephone at the time of interpretation on 02/11/2014 at 5:56 pm to Dr. Shirlyn Goltz, who verbally acknowledged these results.   Electronically Signed   By: Vinnie Langton M.D.   On: 02/11/2014 17:57    Assessment/Plan Principal Problem:   Choledocholithiasis with cholecystitis Active Problems:   CAD (coronary artery disease)   PAF (paroxysmal atrial fibrillation)   HTN (hypertension)   Cognitive changes   1. Choledocholithiasis with cholecystitis  the patient is presenting with complaints of right lower quadrant pain,with abnormally elevated WBC as well as LFT. He had a CT abdomen which was positive for possible cholecystitis. Surgery was initially consulted who evaluated and feel that the patient does not appear to have any cholecystitis appears to have choledocholithiasis   and recommended gastroenterology consultation. Dr. Amedeo Plenty was consulted from GI who recommended medicine admission patient will be seen in the morning. At the time of my evaluation patient remains asymptomatic. Therefore at present I would keep the patient n.p.o. except medication. Hydrate him with IV fluids. Continue with his home PPI.  2.  hypertension. Blood pressure stable continue amlodipine.  3.Hypothyroidism. Continue levothyroxin.  4. elevated LFT. At present holding Zocor.  Advance goals of care discussion:  full    Consults:  Gastroenterology as well as general surgery  DVT Prophylaxis: subcutaneous Heparin Nutrition:  n.p.o. except medications   Family Communication:  daughter was available on the phone, opportunity was given to ask question and all questions were answered satisfactorily at the time of interview. Disposition: Admitted to inpatient in med-surge unit.  Author: Berle Mull, MD [facility] Pager: [phone] 02/11/2014, 11:33 PM    If 7PM-7AM, please contact night-coverage [email or web address] Password [email or web address]

## 2014-02-12 ENCOUNTER — Inpatient Hospital Stay (HOSPITAL_COMMUNITY): Payer: Medicare Other

## 2014-02-12 DIAGNOSIS — R4189 Other symptoms and signs involving cognitive functions and awareness: Secondary | ICD-10-CM | POA: Diagnosis present

## 2014-02-12 LAB — COMPREHENSIVE METABOLIC PANEL
ALBUMIN: 3.1 g/dL — AB (ref 3.5–5.2)
ALT: 55 U/L — ABNORMAL HIGH (ref 0–53)
AST: 30 U/L (ref 0–37)
Alkaline Phosphatase: 144 U/L — ABNORMAL HIGH (ref 39–117)
Anion gap: 13 (ref 5–15)
BUN: 23 mg/dL (ref 6–23)
CALCIUM: 8.5 mg/dL (ref 8.4–10.5)
CHLORIDE: 102 meq/L (ref 96–112)
CO2: 21 mEq/L (ref 19–32)
CREATININE: 0.91 mg/dL (ref 0.50–1.35)
GFR calc Af Amer: 90 mL/min — ABNORMAL LOW (ref >90)
GFR calc non Af Amer: 77 mL/min — ABNORMAL LOW (ref >90)
Glucose, Bld: 127 mg/dL — ABNORMAL HIGH (ref 70–99)
Potassium: 3.3 mEq/L — ABNORMAL LOW (ref 3.7–5.3)
Sodium: 136 mEq/L — ABNORMAL LOW (ref 137–147)
Total Bilirubin: 3.6 mg/dL — ABNORMAL HIGH (ref 0.3–1.2)
Total Protein: 6.1 g/dL (ref 6.0–8.3)

## 2014-02-12 LAB — CBC
HEMATOCRIT: 36.4 % — AB (ref 39.0–52.0)
HEMOGLOBIN: 12.9 g/dL — AB (ref 13.0–17.0)
MCH: 34.1 pg — ABNORMAL HIGH (ref 26.0–34.0)
MCHC: 35.4 g/dL (ref 30.0–36.0)
MCV: 96.3 fL (ref 78.0–100.0)
Platelets: 159 10*3/uL (ref 150–400)
RBC: 3.78 MIL/uL — AB (ref 4.22–5.81)
RDW: 13.5 % (ref 11.5–15.5)
WBC: 11.9 10*3/uL — ABNORMAL HIGH (ref 4.0–10.5)

## 2014-02-12 LAB — GLUCOSE, CAPILLARY
GLUCOSE-CAPILLARY: 144 mg/dL — AB (ref 70–99)
GLUCOSE-CAPILLARY: 118 mg/dL — AB (ref 70–99)
Glucose-Capillary: 106 mg/dL — ABNORMAL HIGH (ref 70–99)

## 2014-02-12 LAB — PROTIME-INR
INR: 1.07 (ref 0.00–1.49)
Prothrombin Time: 13.9 seconds (ref 11.6–15.2)

## 2014-02-12 MED ORDER — SODIUM CHLORIDE 0.9 % IV SOLN
3.0000 g | Freq: Four times a day (QID) | INTRAVENOUS | Status: DC
  Administered 2014-02-12 – 2014-02-13 (×4): 3 g via INTRAVENOUS
  Filled 2014-02-12 (×6): qty 3

## 2014-02-12 MED ORDER — POTASSIUM CHLORIDE IN NACL 40-0.9 MEQ/L-% IV SOLN
INTRAVENOUS | Status: DC
  Administered 2014-02-12: 75 mL/h via INTRAVENOUS
  Filled 2014-02-12 (×2): qty 1000

## 2014-02-12 MED ORDER — MORPHINE SULFATE 2 MG/ML IJ SOLN
2.0000 mg | INTRAMUSCULAR | Status: DC | PRN
  Administered 2014-02-12 (×2): 2 mg via INTRAVENOUS
  Filled 2014-02-12 (×2): qty 1

## 2014-02-12 MED ORDER — METOPROLOL TARTRATE 25 MG PO TABS
25.0000 mg | ORAL_TABLET | Freq: Two times a day (BID) | ORAL | Status: DC
  Administered 2014-02-12 – 2014-02-16 (×9): 25 mg via ORAL
  Filled 2014-02-12 (×12): qty 1

## 2014-02-12 MED ORDER — ACETAMINOPHEN 325 MG PO TABS
650.0000 mg | ORAL_TABLET | Freq: Once | ORAL | Status: AC
  Administered 2014-02-12: 650 mg via ORAL
  Filled 2014-02-12: qty 2

## 2014-02-12 NOTE — Progress Notes (Signed)
Subjective: Complains of mild to moderate RUQ pain that radiates into back with some nausea  Objective: Vital signs in last 24 hours: Temp:  [97.5 F (36.4 C)-97.9 F (36.6 C)] 97.5 F (36.4 C) (10/07 0515) Pulse Rate:  [74-85] 74 (10/07 0515) Resp:  [12-26] 16 (10/07 0515) BP: (101-158)/(63-93) 143/67 mmHg (10/07 0515) SpO2:  [94 %-99 %] 99 % (10/07 0515) Weight:  [184 lb (83.462 kg)-191 lb (86.637 kg)] 191 lb (86.637 kg) (10/07 0515) Last BM Date: 02/11/14  Intake/Output from previous day: 10/06 0701 - 10/07 0700 In: 1506.3 [I.V.:1506.3] Out: -  Intake/Output this shift:    Resp: clear to auscultation bilaterally Cardio: regular rate and rhythm GI: soft, mild RUQ tenderness  Lab Results:   Recent Labs  02/11/14 1615 02/12/14 0728  WBC 10.9* 11.9*  HGB 12.9* 12.9*  HCT 36.8* 36.4*  PLT 145* 159   BMET  Recent Labs  02/11/14 1615 02/12/14 0728  NA 134* 136*  K 4.0 3.3*  CL 97 102  CO2 24 21  GLUCOSE 113* 127*  BUN 28* 23  CREATININE 1.20 0.91  CALCIUM 8.8 8.5   PT/INR  Recent Labs  02/12/14 0728  LABPROT 13.9  INR 1.07   ABG No results found for this basename: PHART, PCO2, PO2, HCO3,  in the last 72 hours  Studies/Results: Ct Abdomen Pelvis W Contrast  02/11/2014   CLINICAL DATA:  Initial evaluation of 80 year old male presenting with nausea and diffuse abdominal pain for the past several days. Intermittent vomiting. White blood cell count mildly elevated.  EXAM: CT ABDOMEN AND PELVIS WITH CONTRAST  TECHNIQUE: Multidetector CT imaging of the abdomen and pelvis was performed using the standard protocol following bolus administration of intravenous contrast.  CONTRAST:  21mL OMNIPAQUE IOHEXOL 300 MG/ML  SOLN  COMPARISON:  No priors.  FINDINGS: Lower chest: Mild scarring in the lung bases bilaterally. Mild cardiomegaly. Atherosclerotic calcifications in the left anterior descending, left circumflex and right coronary arteries. Small hiatal hernia.   Hepatobiliary: Gallbladder appears moderately distended, however, the gallbladder wall appears diffusely thickened measuring up to 7 mm, and there is slight haziness in the surrounding fat, suggesting inflammation. No definite calcified gallstones are identified. There does appear to be some intermediate attenuation material layering dependently in the gallbladder, which could indicate the presence of biliary sludge. The appearance of the liver is normal. No intra or extrahepatic biliary ductal dilatation.  Pancreas: Mild fatty atrophy, but otherwise normal in appearance.  Spleen: Unremarkable.  Adrenals/Urinary Tract: Normal appearance of the adrenal glands bilaterally. The kidneys are also normal in appearance bilaterally. Mild bilateral perinephric stranding (nonspecific). No hydroureteronephrosis. Urinary bladder is normal in appearance.  Stomach/Bowel: The appearance of the stomach is normal. No pathologic dilatation of the small bowel or colon. Numerous colonic diverticulae are noted, most pronounced in the distal descending colon and sigmoid colon, without surrounding inflammatory changes to suggest an acute diverticulitis at this time. Normal appendix. Duodenal diverticulum from the second portion of the duodenum in the region of the pancreatic head incidentally noted.  Vascular/Lymphatic: Extensive atherosclerosis throughout the abdominal and pelvic vasculature, without evidence of aneurysm or dissection. No pathologically enlarged lymph nodes are identified in the abdomen or pelvis. Numerous reactive size retroperitoneal lymph nodes are incidentally noted, but nonspecific  Reproductive: Prostate gland is unremarkable in appearance.  Other: No significant volume of ascites.  No pneumoperitoneum.  Musculoskeletal: Multilevel degenerative disc disease, most severe at L2-L3 and L5-S1. Bilateral pars defects at L5 with 9 mm of anterolisthesis  of L5 upon S1. There are no aggressive appearing lytic or blastic  lesions noted in the visualized portions of the skeleton.  IMPRESSION: 1. Findings, as above, concerning for acute cholecystitis, likely related to the presence of biliary sludge in the gallbladder. Surgical consultation is recommended. 2. Colonic diverticulosis without findings to suggest acute diverticulitis at this time. 3. Normal appendix. 4. Extensive atherosclerosis, including at least 3 vessel coronary artery disease. 5. Small hiatal hernia. 6. Additional incidental findings, as above. These results were called by telephone at the time of interpretation on 02/11/2014 at 5:56 pm to Dr. Shirlyn Goltz, who verbally acknowledged these results.   Electronically Signed   By: Vinnie Langton M.D.   On: 02/11/2014 17:57    Anti-infectives: Anti-infectives   None      Assessment/Plan: s/p * No surgery found * NPO For MRCP today. If there is evidence of CBD stone then he will need ERCP before surgery. Monitor lft's  LOS: 1 day    [name],[name] 02/12/2014

## 2014-02-12 NOTE — Consult Note (Addendum)
[facility] Consult Note  Referring Provider: No ref. provider found Primary Care Physician:  Horatio Pel, MD Primary Gastroenterologist:  Dr.  Laurel Dimmer Complaint: Nausea vomiting HPI: Arthur Mcdaniel is an 80 y.o. white male  who presents with abdominal and back pain and decreased appetite over several days. He saw his primary care physician and had abnormal liver function tests and was sent to the emergency room. He was found to have mildly elevated LFTs with a total bilirubin of 4. CT Scan of the abdomen suggested cholecystitis with possible sludge in the gallbladder with normal caliber common bile duct and no obvious common bile duct stone seen.   Past Medical History  Diagnosis Date  . PAF (paroxysmal atrial fibrillation)   . Ischemic heart disease     Remote stent to LCX in 1997  . Gout   . Hypothyroidism   . Hyperlipidemia     Myalgias with Lipitor  . GERD (gastroesophageal reflux disease)   . Melanoma   . Memory deficit   . Detached retina   . Obese   . Normal nuclear stress test June 2012    Past Surgical History  Procedure Laterality Date  . Coronary stent placement  1997    LCX    Medications Prior to Admission  Medication Sig Dispense Refill  . allopurinol (ZYLOPRIM) 300 MG tablet Take 300 mg by mouth daily.        Marland Kitchen amLODipine (NORVASC) 2.5 MG tablet Take 1 tablet (2.5 mg total) by mouth daily.  90 tablet  0  . aspirin 81 MG tablet Take 81 mg by mouth daily.        . cycloSPORINE (RESTASIS) 0.05 % ophthalmic emulsion Place 1 drop into both eyes 2 (two) times daily.      Marland Kitchen esomeprazole (NEXIUM) 40 MG capsule Take 40 mg by mouth as needed.        . fluticasone (FLONASE) 50 MCG/ACT nasal spray Place 2 sprays into the nose as needed.        . Levothyroxine Sodium 125 MCG CAPS Take by mouth daily before breakfast.      . metFORMIN (GLUCOPHAGE) 850 MG tablet Take 850 mg by mouth 2 (two) times daily with a meal.      . simvastatin (ZOCOR) 20 MG  tablet Take 20 mg by mouth at bedtime.        . vardenafil (LEVITRA) 20 MG tablet Take 20 mg by mouth daily as needed.        . vitamin B-12 (CYANOCOBALAMIN) 1000 MCG tablet Take 1,000 mcg by mouth daily.        . nitroGLYCERIN (NITROSTAT) 0.4 MG SL tablet Place 1 tablet (0.4 mg total) under the tongue every 5 (five) minutes as needed.  25 tablet  11    Allergies:  Allergies  Allergen Reactions  . Naproxen Other (See Comments)    Doesn't remember     Family History  Problem Relation Age of Onset  . Heart disease Sister     Social History:  reports that he has never smoked. He has never used smokeless tobacco. He reports that he does not drink alcohol or use illicit drugs.  Review of Systems: negative except as above   Blood pressure 143/67, pulse 74, temperature 97.5 F (36.4 C), temperature source Oral, resp. rate 16, height _0  (1.803 m), weight 86.637 kg (191 lb), SpO2 99.00%. Head: Normocephalic, without obvious abnormality, atraumatic Neck: no adenopathy, no carotid bruit, no JVD, supple, symmetrical, trachea  midline and thyroid not enlarged, symmetric, no tenderness/mass/nodules Resp: clear to auscultation bilaterally Cardio: regular rate and rhythm, S1, S2 normal, no murmur, click, rub or gallop GI: Abdomen soft nondistended with mild epigastric and right upper quadrant tenderness Extremities: extremities normal, atraumatic, no cyanosis or edema  Results for orders placed during the hospital encounter of 02/11/14 (from the past 48 hour(s))  CBC WITH DIFFERENTIAL     Status: Abnormal   Collection Time    02/11/14  4:15 PM      Result Value Ref Range   WBC 10.9 (*) 4.0 - 10.5 K/uL   RBC 3.78 (*) 4.22 - 5.81 MIL/uL   Hemoglobin 12.9 (*) 13.0 - 17.0 g/dL   HCT 36.8 (*) 39.0 - 52.0 %   MCV 97.4  78.0 - 100.0 fL   MCH 34.1 (*) 26.0 - 34.0 pg   MCHC 35.1  30.0 - 36.0 g/dL   RDW 13.8  11.5 - 15.5 %   Platelets 145 (*) 150 - 400 K/uL   Neutrophils Relative % 74  43 - 77  %   Neutro Abs 8.0 (*) 1.7 - 7.7 K/uL   Lymphocytes Relative 14  12 - 46 %   Lymphs Abs 1.6  0.7 - 4.0 K/uL   Monocytes Relative 11  3 - 12 %   Monocytes Absolute 1.2 (*) 0.1 - 1.0 K/uL   Eosinophils Relative 1  0 - 5 %   Eosinophils Absolute 0.1  0.0 - 0.7 K/uL   Basophils Relative 0  0 - 1 %   Basophils Absolute 0.0  0.0 - 0.1 K/uL  COMPREHENSIVE METABOLIC PANEL     Status: Abnormal   Collection Time    02/11/14  4:15 PM      Result Value Ref Range   Sodium 134 (*) 137 - 147 mEq/L   Potassium 4.0  3.7 - 5.3 mEq/L   Chloride 97  96 - 112 mEq/L   CO2 24  19 - 32 mEq/L   Glucose, Bld 113 (*) 70 - 99 mg/dL   BUN 28 (*) 6 - 23 mg/dL   Creatinine, Ser 1.20  0.50 - 1.35 mg/dL   Calcium 8.8  8.4 - 10.5 mg/dL   Total Protein 6.3  6.0 - 8.3 g/dL   Albumin 3.1 (*) 3.5 - 5.2 g/dL   AST 41 (*) 0 - 37 U/L   ALT 69 (*) 0 - 53 U/L   Alkaline Phosphatase 139 (*) 39 - 117 U/L   Total Bilirubin 4.3 (*) 0.3 - 1.2 mg/dL   GFR calc non Af Amer 55 (*) >90 mL/min   GFR calc Af Amer 64 (*) >90 mL/min   Comment: (NOTE)     The eGFR has been calculated using the CKD EPI equation.     This calculation has not been validated in all clinical situations.     eGFR's persistently <90 mL/min signify possible Chronic Kidney     Disease.   Anion gap 13  5 - 15  LIPASE, BLOOD     Status: Abnormal   Collection Time    02/11/14  4:15 PM      Result Value Ref Range   Lipase 10 (*) 11 - 59 U/L  URINALYSIS, ROUTINE W REFLEX MICROSCOPIC     Status: Abnormal   Collection Time    02/11/14  4:27 PM      Result Value Ref Range   Color, Urine AMBER (*) YELLOW   Comment: BIOCHEMICALS MAY BE AFFECTED  BY COLOR   APPearance CLEAR  CLEAR   Specific Gravity, Urine 1.028  1.005 - 1.030   pH 5.0  5.0 - 8.0   Glucose, UA NEGATIVE  NEGATIVE mg/dL   Hgb urine dipstick NEGATIVE  NEGATIVE   Bilirubin Urine MODERATE (*) NEGATIVE   Ketones, ur 15 (*) NEGATIVE mg/dL   Protein, ur NEGATIVE  NEGATIVE mg/dL   Urobilinogen, UA  1.0  0.0 - 1.0 mg/dL   Nitrite POSITIVE (*) NEGATIVE   Leukocytes, UA SMALL (*) NEGATIVE  URINE MICROSCOPIC-ADD ON     Status: Abnormal   Collection Time    02/11/14  4:27 PM      Result Value Ref Range   Squamous Epithelial / LPF FEW (*) RARE   WBC, UA 0-2  <3 WBC/hpf   Bacteria, UA FEW (*) RARE   Casts HYALINE CASTS (*) NEGATIVE   Urine-Other MUCOUS PRESENT    GLUCOSE, CAPILLARY     Status: Abnormal   Collection Time    02/11/14 11:15 PM      Result Value Ref Range   Glucose-Capillary 143 (*) 70 - 99 mg/dL  GLUCOSE, CAPILLARY     Status: Abnormal   Collection Time    02/12/14  5:29 AM      Result Value Ref Range   Glucose-Capillary 144 (*) 70 - 99 mg/dL  CBC     Status: Abnormal   Collection Time    02/12/14  7:28 AM      Result Value Ref Range   WBC 11.9 (*) 4.0 - 10.5 K/uL   RBC 3.78 (*) 4.22 - 5.81 MIL/uL   Hemoglobin 12.9 (*) 13.0 - 17.0 g/dL   HCT 36.4 (*) 39.0 - 52.0 %   MCV 96.3  78.0 - 100.0 fL   MCH 34.1 (*) 26.0 - 34.0 pg   MCHC 35.4  30.0 - 36.0 g/dL   RDW 13.5  11.5 - 15.5 %   Platelets 159  150 - 400 K/uL   Ct Abdomen Pelvis W Contrast  02/11/2014   CLINICAL DATA:  Initial evaluation of 80 year old male presenting with nausea and diffuse abdominal pain for the past several days. Intermittent vomiting. White blood cell count mildly elevated.  EXAM: CT ABDOMEN AND PELVIS WITH CONTRAST  TECHNIQUE: Multidetector CT imaging of the abdomen and pelvis was performed using the standard protocol following bolus administration of intravenous contrast.  CONTRAST:  81m OMNIPAQUE IOHEXOL 300 MG/ML  SOLN  COMPARISON:  No priors.  FINDINGS: Lower chest: Mild scarring in the lung bases bilaterally. Mild cardiomegaly. Atherosclerotic calcifications in the left anterior descending, left circumflex and right coronary arteries. Small hiatal hernia.  Hepatobiliary: Gallbladder appears moderately distended, however, the gallbladder wall appears diffusely thickened measuring up to 7 mm,  and there is slight haziness in the surrounding fat, suggesting inflammation. No definite calcified gallstones are identified. There does appear to be some intermediate attenuation material layering dependently in the gallbladder, which could indicate the presence of biliary sludge. The appearance of the liver is normal. No intra or extrahepatic biliary ductal dilatation.  Pancreas: Mild fatty atrophy, but otherwise normal in appearance.  Spleen: Unremarkable.  Adrenals/Urinary Tract: Normal appearance of the adrenal glands bilaterally. The kidneys are also normal in appearance bilaterally. Mild bilateral perinephric stranding (nonspecific). No hydroureteronephrosis. Urinary bladder is normal in appearance.  Stomach/Bowel: The appearance of the stomach is normal. No pathologic dilatation of the small bowel or colon. Numerous colonic diverticulae are noted, most pronounced in the distal descending colon  and sigmoid colon, without surrounding inflammatory changes to suggest an acute diverticulitis at this time. Normal appendix. Duodenal diverticulum from the second portion of the duodenum in the region of the pancreatic head incidentally noted.  Vascular/Lymphatic: Extensive atherosclerosis throughout the abdominal and pelvic vasculature, without evidence of aneurysm or dissection. No pathologically enlarged lymph nodes are identified in the abdomen or pelvis. Numerous reactive size retroperitoneal lymph nodes are incidentally noted, but nonspecific  Reproductive: Prostate gland is unremarkable in appearance.  Other: No significant volume of ascites.  No pneumoperitoneum.  Musculoskeletal: Multilevel degenerative disc disease, most severe at L2-L3 and L5-S1. Bilateral pars defects at L5 with 9 mm of anterolisthesis of L5 upon S1. There are no aggressive appearing lytic or blastic lesions noted in the visualized portions of the skeleton.  IMPRESSION: 1. Findings, as above, concerning for acute cholecystitis, likely  related to the presence of biliary sludge in the gallbladder. Surgical consultation is recommended. 2. Colonic diverticulosis without findings to suggest acute diverticulitis at this time. 3. Normal appendix. 4. Extensive atherosclerosis, including at least 3 vessel coronary artery disease. 5. Small hiatal hernia. 6. Additional incidental findings, as above. These results were called by telephone at the time of interpretation on 02/11/2014 at 5:56 pm to Dr. Shirlyn Goltz, who verbally acknowledged these results.   Electronically Signed   By: Vinnie Langton M.D.   On: 02/11/2014 17:57    Assessment: Cholelithiasis with possible choledocholithiasis. Plan: Agree with surgery. Will obtain MRCP and repeat liver function tests in the morning and decide whether ERCP is necessary.  Daley Gosse C 02/12/2014, 8:11 AM

## 2014-02-12 NOTE — Progress Notes (Signed)
ANTIBIOTIC CONSULT NOTE - INITIAL  Pharmacy Consult for Unasyn Indication: inta-abdominal infection  Allergies  Allergen Reactions  . Naproxen Other (See Comments)    Doesn't remember     Patient Measurements: Height: 5\' 11"  (180.3 cm) Weight: 191 lb (86.637 kg) IBW/kg (Calculated) : 75.3   Vital Signs: Temp: 97.5 F (36.4 C) (10/07 0515) Temp Source: Oral (10/07 0515) BP: 143/67 mmHg (10/07 0515) Pulse Rate: 74 (10/07 0515) Intake/Output from previous day: 10/06 0701 - 10/07 0700 In: 1506.3 [I.V.:1506.3] Out: -  Intake/Output from this shift:    Labs:  Recent Labs  02/11/14 1615 02/12/14 0728  WBC 10.9* 11.9*  HGB 12.9* 12.9*  PLT 145* 159  CREATININE 1.20 0.91   Estimated Creatinine Clearance: 67.8 ml/min (by C-G formula based on Cr of 0.91). No results found for this basename: VANCOTROUGH, VANCOPEAK, VANCORANDOM, GENTTROUGH, GENTPEAK, GENTRANDOM, TOBRATROUGH, TOBRAPEAK, TOBRARND, AMIKACINPEAK, AMIKACINTROU, AMIKACIN,  in the last 72 hours   Microbiology: No results found for this or any previous visit (from the past 720 hour(s)).  Medical History: Past Medical History  Diagnosis Date  . PAF (paroxysmal atrial fibrillation)   . Ischemic heart disease     Remote stent to LCX in 1997  . Gout   . Hypothyroidism   . Hyperlipidemia     Myalgias with Lipitor  . GERD (gastroesophageal reflux disease)   . Melanoma   . Memory deficit   . Detached retina   . Obese   . Normal nuclear stress test June 2012    Assessment: 89 YOM who presents with abdominal and back pain along with decreased appetite. LFTs were abnormal at PCP and he was told to come to the ED. CT reveals cholecystitis with possible sludge and GI is following. To start Unasyn for empiric coverage. WBC 11.9, currently afebrile.   Goal of Therapy:  proper dosing based on liver and renal function  Plan:  1. Unasyn 3g IV q6h 2. Follow surgical plans, clinical progression and renal  function  Eyva Califano D. Artelia Game, PharmD, BCPS Clinical Pharmacist Pager: [phone] 02/12/2014 1:47 PM

## 2014-02-12 NOTE — Progress Notes (Signed)
Patient Demographics  Arthur Mcdaniel, is a 80 y.o. male, DOB - April 15, 1931, QAS:[id number]  Admit date - 02/11/2014   Admitting Physician Berle Mull, MD  Outpatient Primary MD for the patient is Horatio Pel, MD  LOS - 1   Chief Complaint  Patient presents with  . Abnormal Lab    WBC up        Subjective:   Arthur Mcdaniel today has, No headache, No chest pain, +ve RUQ abdominal pain - No Nausea, No new weakness tingling or numbness, No Cough - SOB.   Assessment & Plan    1. Acute cholecystitis - likely due to blurry sludge, GI and general surgery following, continue bowel rest with n.p.o. except medications, IV fluids, I will place him on IV Unasyn, GI has ordered MRCP, depending on the results he might require ERCP and then eventual cholecystectomy.     2. History of CAD/PAF. No acute issues, pain free, currently in sinus rhythm on EKG with no acute changes, patient will be a moderate risk for adverse cardiopulmonary outcome but will require semi-emergent surgery due to #1 above. This was explained clearly to patient and wife both except the risks and want to proceed with surgery if needed. Will continue on aspirin, Lopressor, as needed IV beta blocker.     3.HTN. Blood pressure stable continue oral beta blocker.     4. Gout. Continue allopurinol.     5. DM type II. Currently on sliding scale.  CBG (last 3)   Recent Labs  02/11/14 2315 02/12/14 0529 02/12/14 1210  GLUCAP 143* 144* 118*     6. Low potassium. Replace and monitor.       Code Status: Full  Family Communication: Wife bedside  Disposition Plan: Home   Procedures CT scan abdomen pelvis, MRCP, likely ERCP and laparoscopic cholecystectomy later   Consults  GI, general  surgery   Medications  Scheduled Meds: . allopurinol  300 mg Oral Daily  . aspirin EC  81 mg Oral Daily  . heparin  5,000 Units Subcutaneous 3 times per day  . insulin aspart  0-9 Units Subcutaneous Q6H  . levothyroxine  125 mcg Oral QAC breakfast  . metoprolol tartrate  25 mg Oral BID  . pantoprazole  40 mg Oral Daily   Continuous Infusions: . 0.9 % NaCl with KCl 40 mEq / L     PRN Meds:.morphine injection, nitroGLYCERIN, ondansetron (ZOFRAN) IV  DVT Prophylaxis  Lovenox    Lab Results  Component Value Date   PLT 159 02/12/2014    Antibiotics     Anti-infectives   None          Objective:   Filed Vitals:   02/11/14 1800 02/11/14 1815 02/11/14 2114 02/12/14 0515  BP: 146/87  138/93 143/67  Pulse: 78 76 74 74  Temp:   97.9 F (36.6 C) 97.5 F (36.4 C)  TempSrc:   Oral Oral  Resp:  25 18 16   Height:   5\' 11"  (1.803 m)   Weight:   86.501 kg (190 lb 11.2 oz) 86.637 kg (191 lb)  SpO2: 97% 99% 98% 99%    Wt Readings from Last 3 Encounters:  02/12/14 86.637 kg (191 lb)  12/19/13 89.132 kg (196 lb  8 oz)  08/15/12 94.983 kg (209 lb 6.4 oz)     Intake/Output Summary (Last 24 hours) at 02/12/14 1330 Last data filed at 02/12/14 0551  Gross per 24 hour  Intake 1506.25 ml  Output      0 ml  Net 1506.25 ml     Physical Exam  Awake Alert, Oriented X 3, No new F.N deficits, Normal affect Finger.AT,PERRAL Supple Neck,No JVD, No cervical lymphadenopathy appriciated.  Symmetrical Chest wall movement, Good air movement bilaterally, CTAB RRR,No Gallops,Rubs or new Murmurs, No Parasternal Heave +ve B.Sounds, Abd Soft, +ve right upper quadrant tenderness, No organomegaly appriciated, No rebound - guarding or rigidity. No Cyanosis, Clubbing or edema, No new Rash or bruise     Data Review   Micro Results No results found for this or any previous visit (from the past 240 hour(s)).  Radiology Reports Ct Abdomen Pelvis W Contrast  02/11/2014   CLINICAL DATA:   Initial evaluation of 80 year old male presenting with nausea and diffuse abdominal pain for the past several days. Intermittent vomiting. White blood cell count mildly elevated.  EXAM: CT ABDOMEN AND PELVIS WITH CONTRAST  TECHNIQUE: Multidetector CT imaging of the abdomen and pelvis was performed using the standard protocol following bolus administration of intravenous contrast.  CONTRAST:  62mL OMNIPAQUE IOHEXOL 300 MG/ML  SOLN  COMPARISON:  No priors.  FINDINGS: Lower chest: Mild scarring in the lung bases bilaterally. Mild cardiomegaly. Atherosclerotic calcifications in the left anterior descending, left circumflex and right coronary arteries. Small hiatal hernia.  Hepatobiliary: Gallbladder appears moderately distended, however, the gallbladder wall appears diffusely thickened measuring up to 7 mm, and there is slight haziness in the surrounding fat, suggesting inflammation. No definite calcified gallstones are identified. There does appear to be some intermediate attenuation material layering dependently in the gallbladder, which could indicate the presence of biliary sludge. The appearance of the liver is normal. No intra or extrahepatic biliary ductal dilatation.  Pancreas: Mild fatty atrophy, but otherwise normal in appearance.  Spleen: Unremarkable.  Adrenals/Urinary Tract: Normal appearance of the adrenal glands bilaterally. The kidneys are also normal in appearance bilaterally. Mild bilateral perinephric stranding (nonspecific). No hydroureteronephrosis. Urinary bladder is normal in appearance.  Stomach/Bowel: The appearance of the stomach is normal. No pathologic dilatation of the small bowel or colon. Numerous colonic diverticulae are noted, most pronounced in the distal descending colon and sigmoid colon, without surrounding inflammatory changes to suggest an acute diverticulitis at this time. Normal appendix. Duodenal diverticulum from the second portion of the duodenum in the region of the  pancreatic head incidentally noted.  Vascular/Lymphatic: Extensive atherosclerosis throughout the abdominal and pelvic vasculature, without evidence of aneurysm or dissection. No pathologically enlarged lymph nodes are identified in the abdomen or pelvis. Numerous reactive size retroperitoneal lymph nodes are incidentally noted, but nonspecific  Reproductive: Prostate gland is unremarkable in appearance.  Other: No significant volume of ascites.  No pneumoperitoneum.  Musculoskeletal: Multilevel degenerative disc disease, most severe at L2-L3 and L5-S1. Bilateral pars defects at L5 with 9 mm of anterolisthesis of L5 upon S1. There are no aggressive appearing lytic or blastic lesions noted in the visualized portions of the skeleton.  IMPRESSION: 1. Findings, as above, concerning for acute cholecystitis, likely related to the presence of biliary sludge in the gallbladder. Surgical consultation is recommended. 2. Colonic diverticulosis without findings to suggest acute diverticulitis at this time. 3. Normal appendix. 4. Extensive atherosclerosis, including at least 3 vessel coronary artery disease. 5. Small hiatal hernia. 6. Additional incidental  findings, as above. These results were called by telephone at the time of interpretation on 02/11/2014 at 5:56 pm to Dr. Shirlyn Goltz, who verbally acknowledged these results.   Electronically Signed   By: Vinnie Langton M.D.   On: 02/11/2014 17:57     CBC  Recent Labs Lab 02/11/14 1615 02/12/14 0728  WBC 10.9* 11.9*  HGB 12.9* 12.9*  HCT 36.8* 36.4*  PLT 145* 159  MCV 97.4 96.3  MCH 34.1* 34.1*  MCHC 35.1 35.4  RDW 13.8 13.5  LYMPHSABS 1.6  --   MONOABS 1.2*  --   EOSABS 0.1  --   BASOSABS 0.0  --     Chemistries   Recent Labs Lab 02/11/14 1615 02/12/14 0728  NA 134* 136*  K 4.0 3.3*  CL 97 102  CO2 24 21  GLUCOSE 113* 127*  BUN 28* 23  CREATININE 1.20 0.91  CALCIUM 8.8 8.5  AST 41* 30  ALT 69* 55*  ALKPHOS 139* 144*  BILITOT 4.3* 3.6*    ------------------------------------------------------------------------------------------------------------------ estimated creatinine clearance is 67.8 ml/min (by C-G formula based on Cr of 0.91). ------------------------------------------------------------------------------------------------------------------ No results found for this basename: HGBA1C,  in the last 72 hours ------------------------------------------------------------------------------------------------------------------ No results found for this basename: CHOL, HDL, LDLCALC, TRIG, CHOLHDL, LDLDIRECT,  in the last 72 hours ------------------------------------------------------------------------------------------------------------------ No results found for this basename: TSH, T4TOTAL, FREET3, T3FREE, THYROIDAB,  in the last 72 hours ------------------------------------------------------------------------------------------------------------------ No results found for this basename: VITAMINB12, FOLATE, FERRITIN, TIBC, IRON, RETICCTPCT,  in the last 72 hours  Coagulation profile  Recent Labs Lab 02/12/14 0728  INR 1.07    No results found for this basename: DDIMER,  in the last 72 hours  Cardiac Enzymes No results found for this basename: CK, CKMB, TROPONINI, MYOGLOBIN,  in the last 168 hours ------------------------------------------------------------------------------------------------------------------ No components found with this basename: POCBNP,      Time Spent in minutes   35   SINGH,PRASHANT K M.D on 02/12/2014 at 1:30 PM  Between 7am to 7pm - Pager - [phone]  After 7pm go to [email or web address] - password [email or web address]  And look for the night coverage person covering for me after hours  [facility] Office  [phone]

## 2014-02-13 ENCOUNTER — Encounter (HOSPITAL_COMMUNITY): Payer: Self-pay | Admitting: Anesthesiology

## 2014-02-13 ENCOUNTER — Encounter (HOSPITAL_COMMUNITY)
Admission: EM | Disposition: A | Discharge status: Home / Self Care | Payer: Self-pay | Source: Home / Self Care | Attending: Internal Medicine

## 2014-02-13 ENCOUNTER — Inpatient Hospital Stay (HOSPITAL_COMMUNITY): Payer: Medicare Other | Admitting: Certified Registered"

## 2014-02-13 ENCOUNTER — Encounter (HOSPITAL_COMMUNITY): Payer: Medicare Other | Admitting: Certified Registered"

## 2014-02-13 HISTORY — PX: CHOLECYSTECTOMY: SHX55

## 2014-02-13 LAB — COMPREHENSIVE METABOLIC PANEL
ALT: 41 U/L (ref 0–53)
AST: 27 U/L (ref 0–37)
Albumin: 2.7 g/dL — ABNORMAL LOW (ref 3.5–5.2)
Alkaline Phosphatase: 129 U/L — ABNORMAL HIGH (ref 39–117)
Anion gap: 14 (ref 5–15)
BILIRUBIN TOTAL: 2.9 mg/dL — AB (ref 0.3–1.2)
BUN: 18 mg/dL (ref 6–23)
CHLORIDE: 103 meq/L (ref 96–112)
CO2: 20 mEq/L (ref 19–32)
Calcium: 8 mg/dL — ABNORMAL LOW (ref 8.4–10.5)
Creatinine, Ser: 0.93 mg/dL (ref 0.50–1.35)
GFR calc Af Amer: 89 mL/min — ABNORMAL LOW (ref >90)
GFR calc non Af Amer: 77 mL/min — ABNORMAL LOW (ref >90)
Glucose, Bld: 127 mg/dL — ABNORMAL HIGH (ref 70–99)
POTASSIUM: 3.5 meq/L — AB (ref 3.7–5.3)
Sodium: 137 mEq/L (ref 137–147)
Total Protein: 5.5 g/dL — ABNORMAL LOW (ref 6.0–8.3)

## 2014-02-13 LAB — CBC
HEMATOCRIT: 33.6 % — AB (ref 39.0–52.0)
Hemoglobin: 11.7 g/dL — ABNORMAL LOW (ref 13.0–17.0)
MCH: 33.8 pg (ref 26.0–34.0)
MCHC: 34.8 g/dL (ref 30.0–36.0)
MCV: 97.1 fL (ref 78.0–100.0)
PLATELETS: 145 10*3/uL — AB (ref 150–400)
RBC: 3.46 MIL/uL — ABNORMAL LOW (ref 4.22–5.81)
RDW: 13.6 % (ref 11.5–15.5)
WBC: 10.6 10*3/uL — AB (ref 4.0–10.5)

## 2014-02-13 LAB — MAGNESIUM: Magnesium: 1.6 mg/dL (ref 1.5–2.5)

## 2014-02-13 LAB — GLUCOSE, CAPILLARY
GLUCOSE-CAPILLARY: 138 mg/dL — AB (ref 70–99)
GLUCOSE-CAPILLARY: 148 mg/dL — AB (ref 70–99)
Glucose-Capillary: 116 mg/dL — ABNORMAL HIGH (ref 70–99)
Glucose-Capillary: 119 mg/dL — ABNORMAL HIGH (ref 70–99)
Glucose-Capillary: 122 mg/dL — ABNORMAL HIGH (ref 70–99)
Glucose-Capillary: 124 mg/dL — ABNORMAL HIGH (ref 70–99)

## 2014-02-13 LAB — SURGICAL PCR SCREEN
MRSA, PCR: NEGATIVE
Staphylococcus aureus: NEGATIVE

## 2014-02-13 SURGERY — LAPAROSCOPIC CHOLECYSTECTOMY WITH INTRAOPERATIVE CHOLANGIOGRAM
Anesthesia: General | Site: Abdomen

## 2014-02-13 MED ORDER — CEFAZOLIN SODIUM 1-5 GM-% IV SOLN
1.0000 g | Freq: Three times a day (TID) | INTRAVENOUS | Status: DC
  Administered 2014-02-13: 2 g via INTRAVENOUS
  Administered 2014-02-13 – 2014-02-14 (×2): 1 g via INTRAVENOUS
  Filled 2014-02-13 (×4): qty 50

## 2014-02-13 MED ORDER — GLYCOPYRROLATE 0.2 MG/ML IJ SOLN
INTRAMUSCULAR | Status: AC
  Filled 2014-02-13: qty 3

## 2014-02-13 MED ORDER — EPHEDRINE SULFATE 50 MG/ML IJ SOLN
INTRAMUSCULAR | Status: AC
  Filled 2014-02-13: qty 1

## 2014-02-13 MED ORDER — LIDOCAINE HCL (CARDIAC) 20 MG/ML IV SOLN
INTRAVENOUS | Status: AC
  Filled 2014-02-13: qty 5

## 2014-02-13 MED ORDER — STERILE WATER FOR INJECTION IJ SOLN
INTRAMUSCULAR | Status: AC
  Filled 2014-02-13: qty 10

## 2014-02-13 MED ORDER — BUPIVACAINE-EPINEPHRINE (PF) 0.25% -1:200000 IJ SOLN
INTRAMUSCULAR | Status: AC
  Filled 2014-02-13: qty 30

## 2014-02-13 MED ORDER — LIDOCAINE HCL (CARDIAC) 20 MG/ML IV SOLN
INTRAVENOUS | Status: DC | PRN
Start: 2014-02-13 — End: 2014-02-13
  Administered 2014-02-13: 50 mg via INTRAVENOUS

## 2014-02-13 MED ORDER — ONDANSETRON HCL 4 MG/2ML IJ SOLN
INTRAMUSCULAR | Status: DC | PRN
  Administered 2014-02-13: 4 mg via INTRAVENOUS

## 2014-02-13 MED ORDER — METOPROLOL TARTRATE 1 MG/ML IV SOLN
5.0000 mg | INTRAVENOUS | Status: DC | PRN
  Filled 2014-02-13: qty 5

## 2014-02-13 MED ORDER — SODIUM CHLORIDE 0.9 % IR SOLN
Status: DC | PRN
  Administered 2014-02-13: 2000 mL

## 2014-02-13 MED ORDER — ROCURONIUM BROMIDE 100 MG/10ML IV SOLN
INTRAVENOUS | Status: DC | PRN
  Administered 2014-02-13: 10 mg via INTRAVENOUS
  Administered 2014-02-13: 40 mg via INTRAVENOUS

## 2014-02-13 MED ORDER — 0.9 % SODIUM CHLORIDE (POUR BTL) OPTIME
TOPICAL | Status: DC | PRN
  Administered 2014-02-13: 1000 mL

## 2014-02-13 MED ORDER — MIDAZOLAM HCL 5 MG/5ML IJ SOLN
INTRAMUSCULAR | Status: DC | PRN
  Administered 2014-02-13: 1 mg via INTRAVENOUS

## 2014-02-13 MED ORDER — LACTATED RINGERS IV SOLN
INTRAVENOUS | Status: DC
  Administered 2014-02-13: 15:00:00 via INTRAVENOUS

## 2014-02-13 MED ORDER — PROPOFOL 10 MG/ML IV BOLUS
INTRAVENOUS | Status: DC | PRN
  Administered 2014-02-13: 180 mg via INTRAVENOUS

## 2014-02-13 MED ORDER — FENTANYL CITRATE 0.05 MG/ML IJ SOLN
INTRAMUSCULAR | Status: DC | PRN
  Administered 2014-02-13: 150 ug via INTRAVENOUS
  Administered 2014-02-13 (×2): 50 ug via INTRAVENOUS

## 2014-02-13 MED ORDER — SODIUM CHLORIDE 0.9 % IV SOLN
INTRAVENOUS | Status: DC | PRN
  Administered 2014-02-13: 16:00:00

## 2014-02-13 MED ORDER — BUPIVACAINE-EPINEPHRINE 0.25% -1:200000 IJ SOLN
INTRAMUSCULAR | Status: DC | PRN
  Administered 2014-02-13: 30 mL

## 2014-02-13 MED ORDER — MORPHINE SULFATE 4 MG/ML IJ SOLN
4.0000 mg | INTRAMUSCULAR | Status: DC | PRN
  Administered 2014-02-13: 4 mg via INTRAVENOUS
  Administered 2014-02-14 (×2): 2 mg via INTRAVENOUS
  Filled 2014-02-13 (×3): qty 1

## 2014-02-13 MED ORDER — POTASSIUM CHLORIDE IN NACL 40-0.9 MEQ/L-% IV SOLN
INTRAVENOUS | Status: AC
  Administered 2014-02-13 – 2014-02-14 (×2): 75 mL/h via INTRAVENOUS
  Filled 2014-02-13 (×2): qty 1000

## 2014-02-13 MED ORDER — HEPARIN SODIUM (PORCINE) 5000 UNIT/ML IJ SOLN
5000.0000 [IU] | Freq: Three times a day (TID) | INTRAMUSCULAR | Status: DC
  Administered 2014-02-13 – 2014-02-16 (×8): 5000 [IU] via SUBCUTANEOUS
  Filled 2014-02-13 (×13): qty 1

## 2014-02-13 MED ORDER — ONDANSETRON HCL 4 MG/2ML IJ SOLN
INTRAMUSCULAR | Status: AC
  Filled 2014-02-13: qty 2

## 2014-02-13 MED ORDER — LACTATED RINGERS IV SOLN
INTRAVENOUS | Status: DC | PRN
  Administered 2014-02-13 (×2): via INTRAVENOUS

## 2014-02-13 MED ORDER — FENTANYL CITRATE 0.05 MG/ML IJ SOLN: 25.0000 ug | INTRAMUSCULAR | Status: DC | PRN

## 2014-02-13 MED ORDER — NEOSTIGMINE METHYLSULFATE 10 MG/10ML IV SOLN
INTRAVENOUS | Status: DC | PRN
  Administered 2014-02-13: 4 mg via INTRAVENOUS

## 2014-02-13 MED ORDER — GLYCOPYRROLATE 0.2 MG/ML IJ SOLN
INTRAMUSCULAR | Status: DC | PRN
  Administered 2014-02-13: .7 mg via INTRAVENOUS

## 2014-02-13 SURGICAL SUPPLY — 38 items
APPLIER CLIP ROT 10 11.4 M/L (STAPLE) ×2
BLADE SURG ROTATE 9660 (MISCELLANEOUS) ×2 IMPLANT
CANISTER SUCTION 2500CC (MISCELLANEOUS) ×2 IMPLANT
CATH REDDICK CHOLANGI 4FR 50CM (CATHETERS) ×2 IMPLANT
CHLORAPREP W/TINT 26ML (MISCELLANEOUS) ×2 IMPLANT
CLIP APPLIE ROT 10 11.4 M/L (STAPLE) ×1 IMPLANT
COVER MAYO STAND STRL (DRAPES) ×2 IMPLANT
COVER SURGICAL LIGHT HANDLE (MISCELLANEOUS) ×2 IMPLANT
DECANTER SPIKE VIAL GLASS SM (MISCELLANEOUS) ×4 IMPLANT
DERMABOND ADVANCED (GAUZE/BANDAGES/DRESSINGS) ×1
DERMABOND ADVANCED .7 DNX12 (GAUZE/BANDAGES/DRESSINGS) ×1 IMPLANT
DRAPE C-ARM 42X72 X-RAY (DRAPES) ×2 IMPLANT
DRAPE UTILITY 15X26 W/TAPE STR (DRAPE) ×4 IMPLANT
ELECT REM PT RETURN 9FT ADLT (ELECTROSURGICAL) ×2
ELECTRODE REM PT RTRN 9FT ADLT (ELECTROSURGICAL) ×1 IMPLANT
EVACUATOR SILICONE 100CC (DRAIN) ×2 IMPLANT
GLOVE BIO SURGEON STRL SZ7.5 (GLOVE) ×2 IMPLANT
GOWN STRL REUS W/ TWL LRG LVL3 (GOWN DISPOSABLE) ×4 IMPLANT
GOWN STRL REUS W/TWL LRG LVL3 (GOWN DISPOSABLE) ×4
IV CATH 14GX2 1/4 (CATHETERS) ×2 IMPLANT
KIT BASIN OR (CUSTOM PROCEDURE TRAY) ×2 IMPLANT
KIT ROOM TURNOVER OR (KITS) ×2 IMPLANT
NS IRRIG 1000ML POUR BTL (IV SOLUTION) ×2 IMPLANT
PAD ARMBOARD 7.5X6 YLW CONV (MISCELLANEOUS) ×2 IMPLANT
POUCH SPECIMEN RETRIEVAL 10MM (ENDOMECHANICALS) ×2 IMPLANT
SCISSORS LAP 5X35 DISP (ENDOMECHANICALS) ×2 IMPLANT
SET IRRIG TUBING LAPAROSCOPIC (IRRIGATION / IRRIGATOR) ×2 IMPLANT
SLEEVE ENDOPATH XCEL 5M (ENDOMECHANICALS) ×2 IMPLANT
SPECIMEN JAR SMALL (MISCELLANEOUS) ×2 IMPLANT
SUT ETHILON 3 0 PS 1 (SUTURE) ×2 IMPLANT
SUT MNCRL AB 4-0 PS2 18 (SUTURE) ×2 IMPLANT
TOWEL OR 17X24 6PK STRL BLUE (TOWEL DISPOSABLE) ×2 IMPLANT
TOWEL OR 17X26 10 PK STRL BLUE (TOWEL DISPOSABLE) ×2 IMPLANT
TRAY LAPAROSCOPIC (CUSTOM PROCEDURE TRAY) ×2 IMPLANT
TROCAR XCEL BLUNT TIP 100MML (ENDOMECHANICALS) ×2 IMPLANT
TROCAR XCEL NON-BLD 11X100MML (ENDOMECHANICALS) ×4 IMPLANT
TROCAR XCEL NON-BLD 5MMX100MML (ENDOMECHANICALS) ×2 IMPLANT
TUBING INSUFFLATION (TUBING) ×2 IMPLANT

## 2014-02-13 NOTE — Progress Notes (Signed)
Plan for lap chole today

## 2014-02-13 NOTE — Progress Notes (Signed)
Patient Demographics  Arthur Mcdaniel, is a 80 y.o. male, DOB - [birth year], UXL:[id number]  Admit date - 02/11/2014   Admitting Physician Berle Mull, MD  Outpatient Primary MD for the patient is Horatio Pel, MD  LOS - 2   Chief Complaint  Patient presents with  . Abnormal Lab    WBC up        Subjective:   [name] today has, No headache, No chest pain, minimal RUQ abdominal pain - No Nausea, No new weakness tingling or numbness, No Cough - SOB.   Assessment & Plan    1. Acute cholecystitis - likely due to blurry sludge, GI and general surgery following, continue bowel rest with n.p.o. except medications, IV fluids, I will place him on IV Unasyn, MRCP stable. Going to or for laparoscopic cholecystectomy by general surgery today.       2. History of CAD/PAF. No acute issues, pain free, currently in sinus rhythm on EKG with no acute changes, patient will be a moderate risk for adverse cardiopulmonary outcome but will require semi-emergent surgery due to #1 above. This was explained clearly to patient and wife both except the risks and want to proceed with surgery if needed. Will continue on aspirin, Lopressor, as needed IV beta blocker.     3.HTN. Blood pressure stable continue oral beta blocker.     4. Gout. Continue allopurinol.     5. DM type II. Currently on sliding scale.  CBG (last 3)   Recent Labs  02/12/14 1750 02/13/14 0040 02/13/14 0613  GLUCAP 106* 122* 124*     6. Low potassium. Replace and monitor.       Code Status: Full  Family Communication: Wife bedside  Disposition Plan: Home   Procedures CT scan abdomen pelvis, MRCP, likely ERCP and laparoscopic cholecystectomy later   Consults  GI, general  surgery   Medications  Scheduled Meds: . allopurinol  300 mg Oral Daily  . aspirin EC  81 mg Oral Daily  .  ceFAZolin (ANCEF) IV  1 g Intravenous 3 times per day  . heparin  5,000 Units Subcutaneous 3 times per day  . insulin aspart  0-9 Units Subcutaneous Q6H  . levothyroxine  125 mcg Oral QAC breakfast  . metoprolol tartrate  25 mg Oral BID  . pantoprazole  40 mg Oral Daily   Continuous Infusions: . 0.9 % NaCl with KCl 40 mEq / L 75 mL/hr (02/13/14 0744)   PRN Meds:.morphine injection, nitroGLYCERIN, ondansetron (ZOFRAN) IV  DVT Prophylaxis  Lovenox    Lab Results  Component Value Date   PLT 145* 02/13/2014    Antibiotics     Anti-infectives   Start     Dose/Rate Route Frequency Ordered Stop   02/13/14 1400  ceFAZolin (ANCEF) IVPB 1 g/50 mL premix     1 g 100 mL/hr over 30 Minutes Intravenous 3 times per day 02/13/14 0947     02/12/14 1400  Ampicillin-Sulbactam (UNASYN) 3 g in sodium chloride 0.9 % 100 mL IVPB  Status:  Discontinued     3 g 100 mL/hr over 60 Minutes Intravenous Every 6 hours 02/12/14 1347 02/13/14 0947          Objective:   Filed Vitals:  02/12/14 2115 02/13/14 0500 02/13/14 0608 02/13/14 1031  BP: 152/63  156/72 143/64  Pulse: 73  72 75  Temp: 98.1 F (36.7 C)  99 F (37.2 C) 98.4 F (36.9 C)  TempSrc: Oral   Oral  Resp: 17  16 16   Height:      Weight:  86.047 kg (189 lb 11.2 oz)    SpO2: 95%  98%     Wt Readings from Last 3 Encounters:  02/13/14 86.047 kg (189 lb 11.2 oz)  02/13/14 86.047 kg (189 lb 11.2 oz)  12/19/13 89.132 kg (196 lb 8 oz)     Intake/Output Summary (Last 24 hours) at 02/13/14 1044 Last data filed at 02/13/14 0600  Gross per 24 hour  Intake    825 ml  Output      0 ml  Net    825 ml     Physical Exam  Awake Alert, Oriented X 3, No new F.N deficits, Normal affect [place].AT,PERRAL Supple Neck,No JVD, No cervical lymphadenopathy appriciated.  Symmetrical Chest wall movement, Good air movement bilaterally,  CTAB RRR,No Gallops,Rubs or new Murmurs, No Parasternal Heave +ve B.Sounds, Abd Soft, +ve right upper quadrant tenderness, No organomegaly appriciated, No rebound - guarding or rigidity. No Cyanosis, Clubbing or edema, No new Rash or bruise     Data Review   Micro Results No results found for this or any previous visit (from the past 240 hour(s)).  Radiology Reports Ct Abdomen Pelvis W Contrast  02/11/2014   CLINICAL DATA:  Initial evaluation of 81 year old male presenting with nausea and diffuse abdominal pain for the past several days. Intermittent vomiting. White blood cell count mildly elevated.  EXAM: CT ABDOMEN AND PELVIS WITH CONTRAST  TECHNIQUE: Multidetector CT imaging of the abdomen and pelvis was performed using the standard protocol following bolus administration of intravenous contrast.  CONTRAST:  35mL OMNIPAQUE IOHEXOL 300 MG/ML  SOLN  COMPARISON:  No priors.  FINDINGS: Lower chest: Mild scarring in the lung bases bilaterally. Mild cardiomegaly. Atherosclerotic calcifications in the left anterior descending, left circumflex and right coronary arteries. Small hiatal hernia.  Hepatobiliary: Gallbladder appears moderately distended, however, the gallbladder wall appears diffusely thickened measuring up to 7 mm, and there is slight haziness in the surrounding fat, suggesting inflammation. No definite calcified gallstones are identified. There does appear to be some intermediate attenuation material layering dependently in the gallbladder, which could indicate the presence of biliary sludge. The appearance of the liver is normal. No intra or extrahepatic biliary ductal dilatation.  Pancreas: Mild fatty atrophy, but otherwise normal in appearance.  Spleen: Unremarkable.  Adrenals/Urinary Tract: Normal appearance of the adrenal glands bilaterally. The kidneys are also normal in appearance bilaterally. Mild bilateral perinephric stranding (nonspecific). No hydroureteronephrosis. Urinary bladder  is normal in appearance.  Stomach/Bowel: The appearance of the stomach is normal. No pathologic dilatation of the small bowel or colon. Numerous colonic diverticulae are noted, most pronounced in the distal descending colon and sigmoid colon, without surrounding inflammatory changes to suggest an acute diverticulitis at this time. Normal appendix. Duodenal diverticulum from the second portion of the duodenum in the region of the pancreatic head incidentally noted.  Vascular/Lymphatic: Extensive atherosclerosis throughout the abdominal and pelvic vasculature, without evidence of aneurysm or dissection. No pathologically enlarged lymph nodes are identified in the abdomen or pelvis. Numerous reactive size retroperitoneal lymph nodes are incidentally noted, but nonspecific  Reproductive: Prostate gland is unremarkable in appearance.  Other: No significant volume of ascites.  No pneumoperitoneum.  Musculoskeletal:  Multilevel degenerative disc disease, most severe at L2-L3 and L5-S1. Bilateral pars defects at L5 with 9 mm of anterolisthesis of L5 upon S1. There are no aggressive appearing lytic or blastic lesions noted in the visualized portions of the skeleton.  IMPRESSION: 1. Findings, as above, concerning for acute cholecystitis, likely related to the presence of biliary sludge in the gallbladder. Surgical consultation is recommended. 2. Colonic diverticulosis without findings to suggest acute diverticulitis at this time. 3. Normal appendix. 4. Extensive atherosclerosis, including at least 3 vessel coronary artery disease. 5. Small hiatal hernia. 6. Additional incidental findings, as above. These results were called by telephone at the time of interpretation on 02/11/2014 at 5:56 pm to Dr. Shirlyn Goltz, who verbally acknowledged these results.   Electronically Signed   By: Vinnie Langton M.D.   On: 02/11/2014 17:57     CBC  Recent Labs Lab 02/11/14 1615 02/12/14 0728 02/13/14 0500  WBC 10.9* 11.9* 10.6*  HGB  12.9* 12.9* 11.7*  HCT 36.8* 36.4* 33.6*  PLT 145* 159 145*  MCV 97.4 96.3 97.1  MCH 34.1* 34.1* 33.8  MCHC 35.1 35.4 34.8  RDW 13.8 13.5 13.6  LYMPHSABS 1.6  --   --   MONOABS 1.2*  --   --   EOSABS 0.1  --   --   BASOSABS 0.0  --   --     Chemistries   Recent Labs Lab 02/11/14 1615 02/12/14 0728 02/13/14 0500  NA 134* 136* 137  K 4.0 3.3* 3.5*  CL 97 102 103  CO2 24 21 20   GLUCOSE 113* 127* 127*  BUN 28* 23 18  CREATININE 1.20 0.91 0.93  CALCIUM 8.8 8.5 8.0*  MG  --   --  1.6  AST 41* 30 27  ALT 69* 55* 41  ALKPHOS 139* 144* 129*  BILITOT 4.3* 3.6* 2.9*   ------------------------------------------------------------------------------------------------------------------ estimated creatinine clearance is 66.3 ml/min (by C-G formula based on Cr of 0.93). ------------------------------------------------------------------------------------------------------------------ No results found for this basename: HGBA1C,  in the last 72 hours ------------------------------------------------------------------------------------------------------------------ No results found for this basename: CHOL, HDL, LDLCALC, TRIG, CHOLHDL, LDLDIRECT,  in the last 72 hours ------------------------------------------------------------------------------------------------------------------ No results found for this basename: TSH, T4TOTAL, FREET3, T3FREE, THYROIDAB,  in the last 72 hours ------------------------------------------------------------------------------------------------------------------ No results found for this basename: VITAMINB12, FOLATE, FERRITIN, TIBC, IRON, RETICCTPCT,  in the last 72 hours  Coagulation profile  Recent Labs Lab 02/12/14 0728  INR 1.07    No results found for this basename: DDIMER,  in the last 72 hours  Cardiac Enzymes No results found for this basename: CK, CKMB, TROPONINI, MYOGLOBIN,  in the last 168  hours ------------------------------------------------------------------------------------------------------------------ No components found with this basename: POCBNP,      Time Spent in minutes   35   Ellaree Gear K M.D on 02/13/2014 at 10:44 AM  Between 7am to 7pm - Pager - [phone]  After 7pm go to [email or web address] - password [email or web address]  And look for the night coverage person covering for me after hours  [facility] Office  [phone]

## 2014-02-13 NOTE — Op Note (Signed)
02/11/2014 - 02/13/2014  4:54 PM  PATIENT:  Arthur Mcdaniel  80 y.o. male  PRE-OPERATIVE DIAGNOSIS:  gallstones  POST-OPERATIVE DIAGNOSIS:  Gallstones and cholecystitis  PROCEDURE:  Procedure(s): LAPAROSCOPIC CHOLECYSTECTOMY  (N/A)  SURGEON:  Surgeon(s) and Role:    * Jovita Kussmaul, MD - Primary  PHYSICIAN ASSISTANT:   ASSISTANTS: Sharyn Dross, RNFA   ANESTHESIA:   general  EBL:  Total I/O In: 1507.5 [I.V.:1507.5] Out: -   BLOOD ADMINISTERED:none  DRAINS: (1) Jackson-Pratt drain(s) with closed bulb suction in the gallbladder fossa   LOCAL MEDICATIONS USED:  MARCAINE     SPECIMEN:  Source of Specimen:  gallbladder  DISPOSITION OF SPECIMEN:  PATHOLOGY  COUNTS:  YES  TOURNIQUET:  * No tourniquets in log *  DICTATION: .Dragon Dictation @opnoteheader @  Procedure: After informed consent was obtained the patient was brought to the operating room and placed in the supine position on the operating room table. After adequate induction of general anesthesia the patient's abdomen was prepped with ChloraPrep allowed to dry and draped in usual sterile manner. The area below the umbilicus was infiltrated with quarter percent  Marcaine. A small incision was made with a 15 blade knife. The incision was carried down through the subcutaneous tissue bluntly with a hemostat and [employer] retractors. The linea alba was identified. The linea alba was incised with a 15 blade knife and each side was grasped with Coker clamps. The preperitoneal space was then probed with a hemostat until the peritoneum was opened and access was gained to the abdominal cavity. A 0 Vicryl pursestring stitch was placed in the fascia surrounding the opening. A Hassan cannula was then placed through the opening and anchored in place with the previously placed Vicryl purse string stitch. The abdomen was insufflated with carbon dioxide without difficulty. A laparoscope was inserted through the [facility] cannula in the right  upper quadrant was inspected. Next the epigastric region was infiltrated with % Marcaine. A small incision was made with a 15 blade knife. A 10 mm port was placed bluntly through this incision into the abdominal cavity under direct vision. Next 2 sites were chosen laterally on the right side of the abdomen for placement of 5 mm ports. Each of these areas was infiltrated with quarter percent Marcaine. Small stab incisions were made with a 15 blade knife. 5 mm ports were then placed bluntly through these incisions into the abdominal cavity under direct vision without difficulty. The gallbladder was severely inflamed and the omentum had to be bluntly dissected away from the gallbladder. A nijat aspirator was used to aspirate the contents of the gallbladder through the dome in order to be able to grasp the gallbladder. A blunt grasper was placed through the lateralmost 5 mm port and used to grasp the dome of the gallbladder and elevated anteriorly and superiorly. Another blunt grasper was placed through the other 5 mm port and used to retract the body and neck of the gallbladder. Blunt dissection was then carried out with the aspirator and right angle clamp until the cystic duct where it joined the gallbladder was identified. The tissue was so inflamed I did not think we could safely obtain a cholangiogram. 2 clips were placed proximally and one distally on the cystic duct and the duct was divided between the 2 sets of clips. Posterior to this the cystic artery was identified and again dissected bluntly in a circumferential manner until a good window  was created. 2 clips were placed proximally and one  distally on the artery and the artery was divided between the 2 sets of clips. Next a laparoscopic hook cautery device was used to separate the gallbladder from the liver bed. Prior to completely detaching the gallbladder from the liver bed the liver bed was inspected and several small bleeding points were coagulated  with the electrocautery until the area was completely hemostatic. The gallbladder was then detached the rest of it from the liver bed without difficulty. A laparoscopic bag was inserted through the epigastric port. The gallbladder was placed within the bag and the bag was sealed. A grasper was then placed through the lateral 93mm port and out the epigastric port and used to bring a 19Fr blake drain into the abdominal cavity. The drain was placed in the gallbladder fossa. The drain was anchored to the skin with a 3-0 Nylon stitch. A laparoscope was then moved to the epigastric port. The gallbladder grasper was placed through the [facility] cannula and used to grasp the opening of the bag. The bag with the gallbladder was then removed with the [facility] cannula through the infraumbilical port without difficulty. The fascial defect was then closed with the previously placed Vicryl pursestring stitch as well as with another figure-of-eight 0 Vicryl stitch. The liver bed was inspected again and found to be hemostatic. The abdomen was irrigated with copious amounts of saline until the effluent was clear. The ports were then removed under direct vision without difficulty and were found to be hemostatic. The gas was allowed to escape. The skin incisions were all closed with interrupted 4-0 Monocryl subcuticular stitches. Dermabond dressings were applied. The patient tolerated the procedure well. At the end of the case all needle sponge and instrument counts were correct. The patient was then awakened and taken to recovery in stable condition  PLAN OF CARE: Admit to inpatient   PATIENT DISPOSITION:  PACU - hemodynamically stable.   Delay start of Pharmacological VTE agent (>24hrs) due to surgical blood loss or risk of bleeding: yes

## 2014-02-13 NOTE — Anesthesia Postprocedure Evaluation (Signed)
  Anesthesia Post-op Note  Patient: Arthur Mcdaniel  Procedure(s) Performed: Procedure(s): LAPAROSCOPIC CHOLECYSTECTOMY  (N/A)  Patient Location: PACU  Anesthesia Type:General  Level of Consciousness: awake  Airway and Oxygen Therapy: Patient Spontanous Breathing  Post-op Pain: mild  Post-op Assessment: Post-op Vital signs reviewed  Post-op Vital Signs: Reviewed  Last Vitals:  Filed Vitals:   02/13/14 1710  BP:   Pulse:   Temp: 36.5 C  Resp:     Complications: No apparent anesthesia complications

## 2014-02-13 NOTE — Transfer of Care (Signed)
Immediate Anesthesia Transfer of Care Note  Patient: Arthur Mcdaniel  Procedure(s) Performed: Procedure(s): LAPAROSCOPIC CHOLECYSTECTOMY  (N/A)  Patient Location: PACU  Anesthesia Type:General  Level of Consciousness: awake and alert   Airway & Oxygen Therapy: Patient Spontanous Breathing and Patient connected to nasal cannula oxygen  Post-op Assessment: Report given to PACU RN and Post -op Vital signs reviewed and stable  Post vital signs: Reviewed and stable  Complications: No apparent anesthesia complications

## 2014-02-13 NOTE — Anesthesia Preprocedure Evaluation (Addendum)
Anesthesia Evaluation  Patient identified by MRN, date of birth, ID band Patient awake    Reviewed: Allergy & Precautions, H&P , NPO status , Patient's Chart, lab work & pertinent test results  Airway Mallampati: II      Dental   Pulmonary neg pulmonary ROS,  breath sounds clear to auscultation        Cardiovascular hypertension, + CAD Rhythm:Regular Rate:Normal     Neuro/Psych    GI/Hepatic GERD-  ,  Endo/Other  Hypothyroidism   Renal/GU negative Renal ROS     Musculoskeletal   Abdominal   Peds  Hematology   Anesthesia Other Findings   Reproductive/Obstetrics                           Anesthesia Physical Anesthesia Plan  ASA: III  Anesthesia Plan: General   Post-op Pain Management:    Induction: Intravenous  Airway Management Planned: Oral ETT  Additional Equipment:   Intra-op Plan:   Post-operative Plan: Extubation in OR  Informed Consent: I have reviewed the patients History and Physical, chart, labs and discussed the procedure including the risks, benefits and alternatives for the proposed anesthesia with the patient or authorized representative who has indicated his/her understanding and acceptance.   Dental advisory given  Plan Discussed with: CRNA and Anesthesiologist  Anesthesia Plan Comments:         Anesthesia Quick Evaluation

## 2014-02-13 NOTE — Progress Notes (Signed)
Subjective: Right side and right upper quadrant pain much improved.  Objective: Vital signs in last 24 hours: Temp:  [98.1 F (36.7 C)-99 F (37.2 C)] 98.2 F (36.8 C) (10/08 1300) Pulse Rate:  [72-77] 73 (10/08 1300) Resp:  [16-17] 16 (10/08 1300) BP: (132-158)/(58-72) 132/58 mmHg (10/08 1300) SpO2:  [95 %-98 %] 96 % (10/08 1300) Weight:  [86.047 kg (189 lb 11.2 oz)] 86.047 kg (189 lb 11.2 oz) (10/08 0500) Weight change: 2.586 kg (5 lb 11.2 oz) Last BM Date: 02/11/14  PE: GEN:  NAD ABD:  Soft  Lab Results: CBC    Component Value Date/Time   WBC 10.6* 02/13/2014 0500   RBC 3.46* 02/13/2014 0500   HGB 11.7* 02/13/2014 0500   HCT 33.6* 02/13/2014 0500   PLT 145* 02/13/2014 0500   MCV 97.1 02/13/2014 0500   MCH 33.8 02/13/2014 0500   MCHC 34.8 02/13/2014 0500   RDW 13.6 02/13/2014 0500   LYMPHSABS 1.6 02/11/2014 1615   MONOABS 1.2* 02/11/2014 1615   EOSABS 0.1 02/11/2014 1615   BASOSABS 0.0 02/11/2014 1615   CMP     Component Value Date/Time   NA 137 02/13/2014 0500   K 3.5* 02/13/2014 0500   CL 103 02/13/2014 0500   CO2 20 02/13/2014 0500   GLUCOSE 127* 02/13/2014 0500   BUN 18 02/13/2014 0500   CREATININE 0.93 02/13/2014 0500   CALCIUM 8.0* 02/13/2014 0500   PROT 5.5* 02/13/2014 0500   ALBUMIN 2.7* 02/13/2014 0500   AST 27 02/13/2014 0500   ALT 41 02/13/2014 0500   ALKPHOS 129* 02/13/2014 0500   BILITOT 2.9* 02/13/2014 0500   GFRNONAA 77* 02/13/2014 0500   GFRAA 89* 02/13/2014 0500   Studies/Results: MRCP:  No obvious choledocholithiasis.  No biliary ductal dilatation.  Assessment:  1.  Cholecystitis. 2.  Elevated LFTs, downtrending.  Possibly passed bile duct stone. 3.  No evidence of choledocholithiasis on MRCP.  Plan:  1.  Proceed with cholecystectomy with intraoperative cholangiogram. 2.  Based on LFT downtrend and negative MRCP, would not do pre-operative ERCP. 3.  Will await IOC results.   Landry Dyke 02/13/2014, 1:25 PM

## 2014-02-13 NOTE — Anesthesia Procedure Notes (Signed)
Procedure Name: Intubation Date/Time: 02/13/2014 3:27 PM Performed by: Eligha Bridegroom Pre-anesthesia Checklist: Patient identified, Timeout performed, Emergency Drugs available, Suction available and Patient being monitored Patient Re-evaluated:Patient Re-evaluated prior to inductionOxygen Delivery Method: Circle system utilized Preoxygenation: Pre-oxygenation with 100% oxygen Intubation Type: IV induction Laryngoscope Size: Mac and 4 Grade View: Grade I Tube type: Oral Tube size: 7.5 mm Number of attempts: 1 Airway Equipment and Method: Stylet Placement Confirmation: ETT inserted through vocal cords under direct vision,  breath sounds checked- equal and bilateral and positive ETCO2 Secured at: 23 cm Tube secured with: Tape Dental Injury: Teeth and Oropharynx as per pre-operative assessment

## 2014-02-13 NOTE — Progress Notes (Signed)
Patient ID: Arthur Mcdaniel, male   DOB: 06-05-1930, 80 y.o.   MRN: [id number]    Subjective: Pt feels well today.  No major complaints of pain  Objective: Vital signs in last 24 hours: Temp:  [98.1 F (36.7 C)-99 F (37.2 C)] 99 F (37.2 C) (10/08 7408) Pulse Rate:  [72-77] 72 (10/08 0608) Resp:  [16-17] 16 (10/08 0608) BP: (152-158)/(63-72) 156/72 mmHg (10/08 0608) SpO2:  [95 %-98 %] 98 % (10/08 0608) Weight:  [189 lb 11.2 oz (86.047 kg)] 189 lb 11.2 oz (86.047 kg) (10/08 0500) Last BM Date: 02/11/14  Intake/Output from previous day: 10/07 0701 - 10/08 0700 In: 825 [I.V.:825] Out: -  Intake/Output this shift:    PE: Abd: soft, NT, ND, +BS Heart: regular with some PVCs Lungs: CTAB  Lab Results:   Recent Labs  02/12/14 0728 02/13/14 0500  WBC 11.9* 10.6*  HGB 12.9* 11.7*  HCT 36.4* 33.6*  PLT 159 145*   BMET  Recent Labs  02/12/14 0728 02/13/14 0500  NA 136* 137  K 3.3* 3.5*  CL 102 103  CO2 21 20  GLUCOSE 127* 127*  BUN 23 18  CREATININE 0.91 0.93  CALCIUM 8.5 8.0*   PT/INR  Recent Labs  02/12/14 0728  LABPROT 13.9  INR 1.07   CMP     Component Value Date/Time   NA 137 02/13/2014 0500   K 3.5* 02/13/2014 0500   CL 103 02/13/2014 0500   CO2 20 02/13/2014 0500   GLUCOSE 127* 02/13/2014 0500   BUN 18 02/13/2014 0500   CREATININE 0.93 02/13/2014 0500   CALCIUM 8.0* 02/13/2014 0500   PROT 5.5* 02/13/2014 0500   ALBUMIN 2.7* 02/13/2014 0500   AST 27 02/13/2014 0500   ALT 41 02/13/2014 0500   ALKPHOS 129* 02/13/2014 0500   BILITOT 2.9* 02/13/2014 0500   GFRNONAA 77* 02/13/2014 0500   GFRAA 89* 02/13/2014 0500   Lipase     Component Value Date/Time   LIPASE 10* 02/11/2014 1615       Studies/Results: Ct Abdomen Pelvis W Contrast  02/11/2014   CLINICAL DATA:  Initial evaluation of 80 year old male presenting with nausea and diffuse abdominal pain for the past several days. Intermittent vomiting. White blood cell count mildly elevated.  EXAM: CT  ABDOMEN AND PELVIS WITH CONTRAST  TECHNIQUE: Multidetector CT imaging of the abdomen and pelvis was performed using the standard protocol following bolus administration of intravenous contrast.  CONTRAST:  97mL OMNIPAQUE IOHEXOL 300 MG/ML  SOLN  COMPARISON:  No priors.  FINDINGS: Lower chest: Mild scarring in the lung bases bilaterally. Mild cardiomegaly. Atherosclerotic calcifications in the left anterior descending, left circumflex and right coronary arteries. Small hiatal hernia.  Hepatobiliary: Gallbladder appears moderately distended, however, the gallbladder wall appears diffusely thickened measuring up to 7 mm, and there is slight haziness in the surrounding fat, suggesting inflammation. No definite calcified gallstones are identified. There does appear to be some intermediate attenuation material layering dependently in the gallbladder, which could indicate the presence of biliary sludge. The appearance of the liver is normal. No intra or extrahepatic biliary ductal dilatation.  Pancreas: Mild fatty atrophy, but otherwise normal in appearance.  Spleen: Unremarkable.  Adrenals/Urinary Tract: Normal appearance of the adrenal glands bilaterally. The kidneys are also normal in appearance bilaterally. Mild bilateral perinephric stranding (nonspecific). No hydroureteronephrosis. Urinary bladder is normal in appearance.  Stomach/Bowel: The appearance of the stomach is normal. No pathologic dilatation of the small bowel or colon. Numerous colonic diverticulae are  noted, most pronounced in the distal descending colon and sigmoid colon, without surrounding inflammatory changes to suggest an acute diverticulitis at this time. Normal appendix. Duodenal diverticulum from the second portion of the duodenum in the region of the pancreatic head incidentally noted.  Vascular/Lymphatic: Extensive atherosclerosis throughout the abdominal and pelvic vasculature, without evidence of aneurysm or dissection. No pathologically  enlarged lymph nodes are identified in the abdomen or pelvis. Numerous reactive size retroperitoneal lymph nodes are incidentally noted, but nonspecific  Reproductive: Prostate gland is unremarkable in appearance.  Other: No significant volume of ascites.  No pneumoperitoneum.  Musculoskeletal: Multilevel degenerative disc disease, most severe at L2-L3 and L5-S1. Bilateral pars defects at L5 with 9 mm of anterolisthesis of L5 upon S1. There are no aggressive appearing lytic or blastic lesions noted in the visualized portions of the skeleton.  IMPRESSION: 1. Findings, as above, concerning for acute cholecystitis, likely related to the presence of biliary sludge in the gallbladder. Surgical consultation is recommended. 2. Colonic diverticulosis without findings to suggest acute diverticulitis at this time. 3. Normal appendix. 4. Extensive atherosclerosis, including at least 3 vessel coronary artery disease. 5. Small hiatal hernia. 6. Additional incidental findings, as above. These results were called by telephone at the time of interpretation on 02/11/2014 at 5:56 pm to Dr. Shirlyn Goltz, who verbally acknowledged these results.   Electronically Signed   By: Vinnie Langton M.D.   On: 02/11/2014 17:57   Mr Abdomen Mrcp Wo Cm  02/13/2014   CLINICAL DATA:  Acute cholecystitis.  Biliary sludge.  EXAM: MRI ABDOMEN WITHOUT CONTRAST  (INCLUDING MRCP)  TECHNIQUE: Multiplanar multisequence MR imaging of the abdomen was performed. Heavily T2-weighted images of the biliary and pancreatic ducts were obtained, and three-dimensional MRCP images were rendered by post processing.  COMPARISON:  02/11/2014  FINDINGS: As is often the case in the inpatient setting, despite efforts by the technologist and patient, motion artifact is present on today's exam and could not be eliminated. This obscures the biliary tree and reduces exam sensitivity and specificity.  Hepatobiliary: Periportal edema. Narrowed CBD segment in the vicinity of the  cystic duct attachment. No upstream biliary dilatation. Dependent low T2 signal in the gallbladder favoring sludge. Gallbladder wall thickening at 7 millimeters. Low-grade mesenteric edema in the porta hepatis with small and likely reactive lymph nodes in the porta hepatis. No compelling findings for a filling defect in the common bile duct, although sensitivity for small filling defects is reduced due to the motion artifact. There is a periampullary duodenal diverticulum which does not appear inflamed.  The caudate to right lobe ratio is 0.66, suggesting early cirrhosis. No nodularity of the margins of the liver noted.  There is low grade edema tracking around the descending duodenum and gallbladder.  Pancreas: Unremarkable  Spleen: Unremarkable  Adrenals/Urinary Tract: Unremarkable where visualized.  Stomach/Bowel: Aside from the previously mentioned periampullary duodenal diverticulum and edema surrounding the descending duodenum, unremarkable.  Vascular/Lymphatic: Scattered small porta hepatis lymph nodes  Other: No supplemental non-categorized findings.  Musculoskeletal: Unremarkable  IMPRESSION: 1. Gallbladder wall thickening with internal sludge. Cholecystitis is not excluded. There is narrowing of the common hepatic duct/common bile duct in the vicinity of the cystic duct attachment, but not enough to cause upstream dilatation. No definite filling defect in the common bile duct. Sensitivity reduced due to motion artifact. 2. Caudate to right lobe ratio in the liver is 0.66, favoring early serve row cysts. No overt hepatic nodularity is present. 3. Periportal edema. 4. Periampullary duodenal  diverticulum. 5. There is edema signal surrounding the descending duodenum and the adjacent gallbladder. This could be from gallbladder inflammation or inflammation involving the duodenum. If clinical indicators did not support a gallbladder source, then the possibility of duodenal ulcer or duodenitis might be considered.    Electronically Signed   By: Sherryl Barters M.D.   On: 02/13/2014 08:27   Mr 3d Recon At Scanner  02/13/2014   CLINICAL DATA:  Acute cholecystitis.  Biliary sludge.  EXAM: MRI ABDOMEN WITHOUT CONTRAST  (INCLUDING MRCP)  TECHNIQUE: Multiplanar multisequence MR imaging of the abdomen was performed. Heavily T2-weighted images of the biliary and pancreatic ducts were obtained, and three-dimensional MRCP images were rendered by post processing.  COMPARISON:  02/11/2014  FINDINGS: As is often the case in the inpatient setting, despite efforts by the technologist and patient, motion artifact is present on today's exam and could not be eliminated. This obscures the biliary tree and reduces exam sensitivity and specificity.  Hepatobiliary: Periportal edema. Narrowed CBD segment in the vicinity of the cystic duct attachment. No upstream biliary dilatation. Dependent low T2 signal in the gallbladder favoring sludge. Gallbladder wall thickening at 7 millimeters. Low-grade mesenteric edema in the porta hepatis with small and likely reactive lymph nodes in the porta hepatis. No compelling findings for a filling defect in the common bile duct, although sensitivity for small filling defects is reduced due to the motion artifact. There is a periampullary duodenal diverticulum which does not appear inflamed.  The caudate to right lobe ratio is 0.66, suggesting early cirrhosis. No nodularity of the margins of the liver noted.  There is low grade edema tracking around the descending duodenum and gallbladder.  Pancreas: Unremarkable  Spleen: Unremarkable  Adrenals/Urinary Tract: Unremarkable where visualized.  Stomach/Bowel: Aside from the previously mentioned periampullary duodenal diverticulum and edema surrounding the descending duodenum, unremarkable.  Vascular/Lymphatic: Scattered small porta hepatis lymph nodes  Other: No supplemental non-categorized findings.  Musculoskeletal: Unremarkable  IMPRESSION: 1. Gallbladder wall  thickening with internal sludge. Cholecystitis is not excluded. There is narrowing of the common hepatic duct/common bile duct in the vicinity of the cystic duct attachment, but not enough to cause upstream dilatation. No definite filling defect in the common bile duct. Sensitivity reduced due to motion artifact. 2. Caudate to right lobe ratio in the liver is 0.66, favoring early serve row cysts. No overt hepatic nodularity is present. 3. Periportal edema. 4. Periampullary duodenal diverticulum. 5. There is edema signal surrounding the descending duodenum and the adjacent gallbladder. This could be from gallbladder inflammation or inflammation involving the duodenum. If clinical indicators did not support a gallbladder source, then the possibility of duodenal ulcer or duodenitis might be considered.   Electronically Signed   By: Sherryl Barters M.D.   On: 02/13/2014 08:27    Anti-infectives: Anti-infectives   Start     Dose/Rate Route Frequency Ordered Stop   02/13/14 1400  ceFAZolin (ANCEF) IVPB 1 g/50 mL premix     1 g 100 mL/hr over 30 Minutes Intravenous 3 times per day 02/13/14 0947     02/12/14 1400  Ampicillin-Sulbactam (UNASYN) 3 g in sodium chloride 0.9 % 100 mL IVPB  Status:  Discontinued     3 g 100 mL/hr over 60 Minutes Intravenous Every 6 hours 02/12/14 1347 02/13/14 0947       Assessment/Plan  1. Acute cholecystitis 2. Elevated LFTs Patient Active Problem List   Diagnosis Date Noted  . Cognitive changes 02/12/2014  . Choledocholithiasis 02/11/2014  .  Choledocholithiasis with cholecystitis 02/11/2014  . CAD (coronary artery disease) 01/20/2011  . PAF (paroxysmal atrial fibrillation) 01/20/2011  . HTN (hypertension) 01/20/2011  . Hyperlipidemia 01/20/2011   Plan: 1. MRCP is negative for CBD stone.  He is NPO.  Will plan for OR later today.  Hold heparin. 2. D/w patient and his family.  They are agreeable   LOS: 2 days    Lissette Schenk E 02/13/2014, 9:48 AM Pager:  [phone]

## 2014-02-14 ENCOUNTER — Inpatient Hospital Stay (HOSPITAL_COMMUNITY): Payer: Medicare Other

## 2014-02-14 ENCOUNTER — Encounter (HOSPITAL_COMMUNITY): Payer: Self-pay | Admitting: General Surgery

## 2014-02-14 LAB — GLUCOSE, CAPILLARY
GLUCOSE-CAPILLARY: 119 mg/dL — AB (ref 70–99)
Glucose-Capillary: 123 mg/dL — ABNORMAL HIGH (ref 70–99)
Glucose-Capillary: 139 mg/dL — ABNORMAL HIGH (ref 70–99)

## 2014-02-14 LAB — URINALYSIS, ROUTINE W REFLEX MICROSCOPIC
Bilirubin Urine: NEGATIVE
Glucose, UA: NEGATIVE mg/dL
Hgb urine dipstick: NEGATIVE
KETONES UR: 15 mg/dL — AB
Leukocytes, UA: NEGATIVE
NITRITE: NEGATIVE
Protein, ur: NEGATIVE mg/dL
SPECIFIC GRAVITY, URINE: 1.016 (ref 1.005–1.030)
Urobilinogen, UA: 1 mg/dL (ref 0.0–1.0)
pH: 5.5 (ref 5.0–8.0)

## 2014-02-14 LAB — COMPREHENSIVE METABOLIC PANEL
ALK PHOS: 124 U/L — AB (ref 39–117)
ALT: 42 U/L (ref 0–53)
AST: 51 U/L — AB (ref 0–37)
Albumin: 2.3 g/dL — ABNORMAL LOW (ref 3.5–5.2)
Anion gap: 16 — ABNORMAL HIGH (ref 5–15)
BUN: 14 mg/dL (ref 6–23)
CO2: 19 meq/L (ref 19–32)
Calcium: 7.5 mg/dL — ABNORMAL LOW (ref 8.4–10.5)
Chloride: 102 mEq/L (ref 96–112)
Creatinine, Ser: 0.99 mg/dL (ref 0.50–1.35)
GFR calc non Af Amer: 75 mL/min — ABNORMAL LOW (ref >90)
GFR, EST AFRICAN AMERICAN: 87 mL/min — AB (ref >90)
Glucose, Bld: 127 mg/dL — ABNORMAL HIGH (ref 70–99)
POTASSIUM: 3.9 meq/L (ref 3.7–5.3)
SODIUM: 137 meq/L (ref 137–147)
TOTAL PROTEIN: 5.3 g/dL — AB (ref 6.0–8.3)
Total Bilirubin: 2.1 mg/dL — ABNORMAL HIGH (ref 0.3–1.2)

## 2014-02-14 LAB — CBC
HCT: 31.9 % — ABNORMAL LOW (ref 39.0–52.0)
HEMOGLOBIN: 11.2 g/dL — AB (ref 13.0–17.0)
MCH: 35.2 pg — AB (ref 26.0–34.0)
MCHC: 35.1 g/dL (ref 30.0–36.0)
MCV: 100.3 fL — AB (ref 78.0–100.0)
Platelets: 165 10*3/uL (ref 150–400)
RBC: 3.18 MIL/uL — ABNORMAL LOW (ref 4.22–5.81)
RDW: 13.9 % (ref 11.5–15.5)
WBC: 14.8 10*3/uL — ABNORMAL HIGH (ref 4.0–10.5)

## 2014-02-14 MED ORDER — PIPERACILLIN-TAZOBACTAM 3.375 G IVPB
3.3750 g | Freq: Three times a day (TID) | INTRAVENOUS | Status: DC
Start: 2014-02-14 — End: 2014-02-16
  Administered 2014-02-14 – 2014-02-16 (×7): 3.375 g via INTRAVENOUS
  Filled 2014-02-14 (×11): qty 50

## 2014-02-14 MED ORDER — ACETAMINOPHEN 325 MG PO TABS
650.0000 mg | ORAL_TABLET | Freq: Four times a day (QID) | ORAL | Status: DC | PRN
  Administered 2014-02-14 – 2014-02-15 (×5): 650 mg via ORAL
  Filled 2014-02-14 (×5): qty 2

## 2014-02-14 MED ORDER — POTASSIUM CHLORIDE IN NACL 40-0.9 MEQ/L-% IV SOLN
INTRAVENOUS | Status: AC
  Administered 2014-02-14: 50 mL/h via INTRAVENOUS
  Filled 2014-02-14: qty 1000

## 2014-02-14 MED ORDER — FUROSEMIDE 10 MG/ML IJ SOLN
INTRAMUSCULAR | Status: AC
  Administered 2014-02-14: 20 mg via INTRAVENOUS
  Filled 2014-02-14: qty 4

## 2014-02-14 MED ORDER — FUROSEMIDE 10 MG/ML IJ SOLN
20.0000 mg | Freq: Once | INTRAMUSCULAR | Status: AC
  Administered 2014-02-14: 20 mg via INTRAVENOUS

## 2014-02-14 NOTE — Progress Notes (Signed)
Patient ID: Arthur Mcdaniel, male   DOB: 1930-10-02, 80 y.o.   MRN: [id number] 1 Day Post-Op  Subjective: Pt feeling ok today, but appropriate pain with movement.  Tolerating his clear liquids, not a great appetite.  Voiding well  Objective: Vital signs in last 24 hours: Temp:  [97.7 F (36.5 C)-102.4 F (39.1 C)] 98.8 F (37.1 C) (10/09 0511) Pulse Rate:  [64-98] 78 (10/09 0511) Resp:  [16-25] 16 (10/09 0511) BP: (102-162)/(49-83) 112/49 mmHg (10/09 0511) SpO2:  [94 %-100 %] 94 % (10/09 0511) Last BM Date: 02/11/14  Intake/Output from previous day: 10/08 0701 - 10/09 0700 In: 2207.5 [I.V.:2207.5] Out: 75 [Drains:75] Intake/Output this shift:    PE: Abd: soft, appropriately tender, incisions c/d/i, JP drain with serosang output, +BS  Lab Results:   Recent Labs  02/13/14 0500 02/14/14 0458  WBC 10.6* 14.8*  HGB 11.7* 11.2*  HCT 33.6* 31.9*  PLT 145* 165   BMET  Recent Labs  02/13/14 0500 02/14/14 0458  NA 137 137  K 3.5* 3.9  CL 103 102  CO2 20 19  GLUCOSE 127* 127*  BUN 18 14  CREATININE 0.93 0.99  CALCIUM 8.0* 7.5*   PT/INR  Recent Labs  02/12/14 0728  LABPROT 13.9  INR 1.07   CMP     Component Value Date/Time   NA 137 02/14/2014 0458   K 3.9 02/14/2014 0458   CL 102 02/14/2014 0458   CO2 19 02/14/2014 0458   GLUCOSE 127* 02/14/2014 0458   BUN 14 02/14/2014 0458   CREATININE 0.99 02/14/2014 0458   CALCIUM 7.5* 02/14/2014 0458   PROT 5.3* 02/14/2014 0458   ALBUMIN 2.3* 02/14/2014 0458   AST 51* 02/14/2014 0458   ALT 42 02/14/2014 0458   ALKPHOS 124* 02/14/2014 0458   BILITOT 2.1* 02/14/2014 0458   GFRNONAA 75* 02/14/2014 0458   GFRAA 87* 02/14/2014 0458   Lipase     Component Value Date/Time   LIPASE 10* 02/11/2014 1615       Studies/Results: Mr Abdomen Mrcp Wo Cm  02/13/2014   CLINICAL DATA:  Acute cholecystitis.  Biliary sludge.  EXAM: MRI ABDOMEN WITHOUT CONTRAST  (INCLUDING MRCP)  TECHNIQUE: Multiplanar multisequence MR imaging of the  abdomen was performed. Heavily T2-weighted images of the biliary and pancreatic ducts were obtained, and three-dimensional MRCP images were rendered by post processing.  COMPARISON:  02/11/2014  FINDINGS: As is often the case in the inpatient setting, despite efforts by the technologist and patient, motion artifact is present on today's exam and could not be eliminated. This obscures the biliary tree and reduces exam sensitivity and specificity.  Hepatobiliary: Periportal edema. Narrowed CBD segment in the vicinity of the cystic duct attachment. No upstream biliary dilatation. Dependent low T2 signal in the gallbladder favoring sludge. Gallbladder wall thickening at 7 millimeters. Low-grade mesenteric edema in the porta hepatis with small and likely reactive lymph nodes in the porta hepatis. No compelling findings for a filling defect in the common bile duct, although sensitivity for small filling defects is reduced due to the motion artifact. There is a periampullary duodenal diverticulum which does not appear inflamed.  The caudate to right lobe ratio is 0.66, suggesting early cirrhosis. No nodularity of the margins of the liver noted.  There is low grade edema tracking around the descending duodenum and gallbladder.  Pancreas: Unremarkable  Spleen: Unremarkable  Adrenals/Urinary Tract: Unremarkable where visualized.  Stomach/Bowel: Aside from the previously mentioned periampullary duodenal diverticulum and edema surrounding the descending duodenum,  unremarkable.  Vascular/Lymphatic: Scattered small porta hepatis lymph nodes  Other: No supplemental non-categorized findings.  Musculoskeletal: Unremarkable  IMPRESSION: 1. Gallbladder wall thickening with internal sludge. Cholecystitis is not excluded. There is narrowing of the common hepatic duct/common bile duct in the vicinity of the cystic duct attachment, but not enough to cause upstream dilatation. No definite filling defect in the common bile duct. Sensitivity  reduced due to motion artifact. 2. Caudate to right lobe ratio in the liver is 0.66, favoring early serve row cysts. No overt hepatic nodularity is present. 3. Periportal edema. 4. Periampullary duodenal diverticulum. 5. There is edema signal surrounding the descending duodenum and the adjacent gallbladder. This could be from gallbladder inflammation or inflammation involving the duodenum. If clinical indicators did not support a gallbladder source, then the possibility of duodenal ulcer or duodenitis might be considered.   Electronically Signed   By: Sherryl Barters M.D.   On: 02/13/2014 08:27   Mr 3d Recon At Scanner  02/13/2014   CLINICAL DATA:  Acute cholecystitis.  Biliary sludge.  EXAM: MRI ABDOMEN WITHOUT CONTRAST  (INCLUDING MRCP)  TECHNIQUE: Multiplanar multisequence MR imaging of the abdomen was performed. Heavily T2-weighted images of the biliary and pancreatic ducts were obtained, and three-dimensional MRCP images were rendered by post processing.  COMPARISON:  02/11/2014  FINDINGS: As is often the case in the inpatient setting, despite efforts by the technologist and patient, motion artifact is present on today's exam and could not be eliminated. This obscures the biliary tree and reduces exam sensitivity and specificity.  Hepatobiliary: Periportal edema. Narrowed CBD segment in the vicinity of the cystic duct attachment. No upstream biliary dilatation. Dependent low T2 signal in the gallbladder favoring sludge. Gallbladder wall thickening at 7 millimeters. Low-grade mesenteric edema in the porta hepatis with small and likely reactive lymph nodes in the porta hepatis. No compelling findings for a filling defect in the common bile duct, although sensitivity for small filling defects is reduced due to the motion artifact. There is a periampullary duodenal diverticulum which does not appear inflamed.  The caudate to right lobe ratio is 0.66, suggesting early cirrhosis. No nodularity of the margins of  the liver noted.  There is low grade edema tracking around the descending duodenum and gallbladder.  Pancreas: Unremarkable  Spleen: Unremarkable  Adrenals/Urinary Tract: Unremarkable where visualized.  Stomach/Bowel: Aside from the previously mentioned periampullary duodenal diverticulum and edema surrounding the descending duodenum, unremarkable.  Vascular/Lymphatic: Scattered small porta hepatis lymph nodes  Other: No supplemental non-categorized findings.  Musculoskeletal: Unremarkable  IMPRESSION: 1. Gallbladder wall thickening with internal sludge. Cholecystitis is not excluded. There is narrowing of the common hepatic duct/common bile duct in the vicinity of the cystic duct attachment, but not enough to cause upstream dilatation. No definite filling defect in the common bile duct. Sensitivity reduced due to motion artifact. 2. Caudate to right lobe ratio in the liver is 0.66, favoring early serve row cysts. No overt hepatic nodularity is present. 3. Periportal edema. 4. Periampullary duodenal diverticulum. 5. There is edema signal surrounding the descending duodenum and the adjacent gallbladder. This could be from gallbladder inflammation or inflammation involving the duodenum. If clinical indicators did not support a gallbladder source, then the possibility of duodenal ulcer or duodenitis might be considered.   Electronically Signed   By: Sherryl Barters M.D.   On: 02/13/2014 08:27   Dg Chest Port 1 View  02/14/2014   CLINICAL DATA:  Initial encounter for Cough and fever beginning last night. Recent  cholecystectomy.  EXAM: PORTABLE CHEST - 1 VIEW  COMPARISON:  07/10/2006  FINDINGS: 0746 hrs. Low volume film with vascular congestion and interstitial pulmonary edema. There is subsegmental atelectasis at the right base. The cardio pericardial silhouette is enlarged. Imaged bony structures of the thorax are intact.  IMPRESSION: Cardiomegaly with interstitial pulmonary edema.   Electronically Signed   By:  Misty Stanley M.D.   On: 02/14/2014 07:57    Anti-infectives: Anti-infectives   Start     Dose/Rate Route Frequency Ordered Stop   02/14/14 0745  piperacillin-tazobactam (ZOSYN) IVPB 3.375 g     3.375 g 12.5 mL/hr over 240 Minutes Intravenous 3 times per day 02/14/14 0735     02/13/14 1400  ceFAZolin (ANCEF) IVPB 1 g/50 mL premix  Status:  Discontinued     1 g 100 mL/hr over 30 Minutes Intravenous 3 times per day 02/13/14 0947 02/14/14 0735   02/12/14 1400  Ampicillin-Sulbactam (UNASYN) 3 g in sodium chloride 0.9 % 100 mL IVPB  Status:  Discontinued     3 g 100 mL/hr over 60 Minutes Intravenous Every 6 hours 02/12/14 1347 02/13/14 0947       Assessment/Plan  1. POD 1, s/p lap chole for acute cholecystitis 2. Elevated LFTs, trending down 3. Fever  Plan: 1. Change IV abx therapy to zosyn and cont this for at least today 2. Advance his diet to low fat diet 3. PT eval given it is taking several people to get him up and moving around right now 4. Cont with JP drain, ? Whether he will need to go home with drain or not. 5. Here until at least tomorrow.   LOS: 3 days    Maejor Erven E 02/14/2014, 10:10 AM Pager: [phone]

## 2014-02-14 NOTE — Care Management Note (Signed)
  Page 1 of 1   02/14/2014     11:47:27 AM CARE MANAGEMENT NOTE 02/14/2014  Patient:  Arthur Mcdaniel, Arthur Mcdaniel   Account Number:  [id number]  Date Initiated:  02/14/2014  Documentation initiated by:  Magdalen Spatz  Subjective/Objective Assessment:     Action/Plan:   Anticipated DC Date:     Anticipated DC Plan:           Choice offered to / List presented to:             Status of service:   Medicare Important Message given?  YES (If response is "NO", the following Medicare IM given date fields will be blank) Date Medicare IM given:  02/14/2014 Medicare IM given by:  Magdalen Spatz Date Additional Medicare IM given:   Additional Medicare IM given by:    Discharge Disposition:    Per UR Regulation:    If discussed at Long Length of Stay Meetings, dates discussed:    Comments:

## 2014-02-14 NOTE — Progress Notes (Signed)
Patient Demographics  Arthur Mcdaniel, is a 80 y.o. male, DOB - Apr 10, 1931, GEX:[id number]  Admit date - 02/11/2014   Admitting Physician Berle Mull, MD  Outpatient Primary MD for the patient is Horatio Pel, MD  LOS - 3   Chief Complaint  Patient presents with  . Abnormal Lab    WBC up        Subjective:   [name] today has, No headache, No chest pain, minimal RUQ abdominal pain - No Nausea, No new weakness tingling or numbness, No Cough - SOB.   Assessment & Plan    1. Acute cholecystitis - likely due to biliary sludge, GI and general surgery following, continue bowel rest with n.p.o. except medications, IV fluids,  MRCP stable. Post laparoscopic cholecystectomy by general surgery on 02/13/2014, has a drain in place,  has been placed on Zosyn. Monitor cultures. Still has mild reactive leukocytosis.     2. History of CAD/PAF. No acute issues, pain free, currently in sinus rhythm on EKG with no acute changes - continue beta blocker tolerated the surgery well.     3.HTN. Blood pressure stable continue oral beta blocker.     4. Gout. Continue allopurinol.     5. DM type II. Currently on sliding scale.  CBG (last 3)   Recent Labs  02/13/14 1722 02/13/14 2346 02/14/14 0616  GLUCAP 148* 138* 123*     6. Low potassium. Replace and stable.       Code Status: Full  Family Communication: Wife bedside  Disposition Plan: Home   Procedures CT scan abdomen pelvis, MRCP, laparoscopic cholecystectomy 02/13/2014   Consults  GI, general surgery   Medications  Scheduled Meds: . allopurinol  300 mg Oral Daily  . aspirin EC  81 mg Oral Daily  . furosemide  20 mg Intravenous Once  . heparin  5,000 Units Subcutaneous 3 times per day  . insulin aspart   0-9 Units Subcutaneous Q6H  . levothyroxine  125 mcg Oral QAC breakfast  . metoprolol tartrate  25 mg Oral BID  . pantoprazole  40 mg Oral Daily  . piperacillin-tazobactam (ZOSYN)  IV  3.375 g Intravenous 3 times per day   Continuous Infusions: . 0.9 % NaCl with KCl 40 mEq / L     PRN Meds:.acetaminophen, metoprolol, morphine injection, nitroGLYCERIN, ondansetron (ZOFRAN) IV  DVT Prophylaxis  Lovenox    Lab Results  Component Value Date   PLT 165 02/14/2014    Antibiotics     Anti-infectives   Start     Dose/Rate Route Frequency Ordered Stop   02/14/14 0745  piperacillin-tazobactam (ZOSYN) IVPB 3.375 g     3.375 g 12.5 mL/hr over 240 Minutes Intravenous 3 times per day 02/14/14 0735     02/13/14 1400  ceFAZolin (ANCEF) IVPB 1 g/50 mL premix  Status:  Discontinued     1 g 100 mL/hr over 30 Minutes Intravenous 3 times per day 02/13/14 0947 02/14/14 0735   02/12/14 1400  Ampicillin-Sulbactam (UNASYN) 3 g in sodium chloride 0.9 % 100 mL IVPB  Status:  Discontinued     3 g 100 mL/hr over 60 Minutes Intravenous Every 6 hours 02/12/14 1347 02/13/14 0947  Objective:   Filed Vitals:   02/13/14 2139 02/14/14 0146 02/14/14 0340 02/14/14 0511  BP: 162/69 128/53  112/49  Pulse: 88 98  78  Temp: 99.9 F (37.7 C) 102.4 F (39.1 C) 98.9 F (37.2 C) 98.8 F (37.1 C)  TempSrc: Oral Axillary Oral Oral  Resp: 18 16  16   Height:      Weight:      SpO2: 98% 94%  94%    Wt Readings from Last 3 Encounters:  02/13/14 86.047 kg (189 lb 11.2 oz)  02/13/14 86.047 kg (189 lb 11.2 oz)  12/19/13 89.132 kg (196 lb 8 oz)     Intake/Output Summary (Last 24 hours) at 02/14/14 0959 Last data filed at 02/13/14 1730  Gross per 24 hour  Intake 2207.5 ml  Output     75 ml  Net 2132.5 ml     Physical Exam  Awake Alert, Oriented X 3, No new F.N deficits, Normal affect [place].AT,PERRAL Supple Neck,No JVD, No cervical lymphadenopathy appriciated.  Symmetrical Chest wall movement,  Good air movement bilaterally, CTAB RRR,No Gallops,Rubs or new Murmurs, No Parasternal Heave +ve B.Sounds, Abd Soft, right upper quadrant JP drain, No organomegaly appriciated, No rebound - guarding or rigidity. No Cyanosis, Clubbing or edema, No new Rash or bruise     Data Review   Micro Results Recent Results (from the past 240 hour(s))  SURGICAL PCR SCREEN     Status: None   Collection Time    02/13/14 12:32 PM      Result Value Ref Range Status   MRSA, PCR NEGATIVE  NEGATIVE Final   Staphylococcus aureus NEGATIVE  NEGATIVE Final   Comment:            The Xpert SA Assay (FDA     approved for NASAL specimens     in patients over 38 years of age),     is one component of     a comprehensive surveillance     program.  Test performance has     been validated by [employer] for patients greater     than or equal to 68 year old.     It is not intended     to diagnose infection nor to     guide or monitor treatment.    Radiology Reports Ct Abdomen Pelvis W Contrast  02/11/2014   CLINICAL DATA:  Initial evaluation of 80 year old male presenting with nausea and diffuse abdominal pain for the past several days. Intermittent vomiting. White blood cell count mildly elevated.  EXAM: CT ABDOMEN AND PELVIS WITH CONTRAST  TECHNIQUE: Multidetector CT imaging of the abdomen and pelvis was performed using the standard protocol following bolus administration of intravenous contrast.  CONTRAST:  46mL OMNIPAQUE IOHEXOL 300 MG/ML  SOLN  COMPARISON:  No priors.  FINDINGS: Lower chest: Mild scarring in the lung bases bilaterally. Mild cardiomegaly. Atherosclerotic calcifications in the left anterior descending, left circumflex and right coronary arteries. Small hiatal hernia.  Hepatobiliary: Gallbladder appears moderately distended, however, the gallbladder wall appears diffusely thickened measuring up to 7 mm, and there is slight haziness in the surrounding fat, suggesting inflammation. No definite  calcified gallstones are identified. There does appear to be some intermediate attenuation material layering dependently in the gallbladder, which could indicate the presence of biliary sludge. The appearance of the liver is normal. No intra or extrahepatic biliary ductal dilatation.  Pancreas: Mild fatty atrophy, but otherwise normal in appearance.  Spleen: Unremarkable.  Adrenals/Urinary  Tract: Normal appearance of the adrenal glands bilaterally. The kidneys are also normal in appearance bilaterally. Mild bilateral perinephric stranding (nonspecific). No hydroureteronephrosis. Urinary bladder is normal in appearance.  Stomach/Bowel: The appearance of the stomach is normal. No pathologic dilatation of the small bowel or colon. Numerous colonic diverticulae are noted, most pronounced in the distal descending colon and sigmoid colon, without surrounding inflammatory changes to suggest an acute diverticulitis at this time. Normal appendix. Duodenal diverticulum from the second portion of the duodenum in the region of the pancreatic head incidentally noted.  Vascular/Lymphatic: Extensive atherosclerosis throughout the abdominal and pelvic vasculature, without evidence of aneurysm or dissection. No pathologically enlarged lymph nodes are identified in the abdomen or pelvis. Numerous reactive size retroperitoneal lymph nodes are incidentally noted, but nonspecific  Reproductive: Prostate gland is unremarkable in appearance.  Other: No significant volume of ascites.  No pneumoperitoneum.  Musculoskeletal: Multilevel degenerative disc disease, most severe at L2-L3 and L5-S1. Bilateral pars defects at L5 with 9 mm of anterolisthesis of L5 upon S1. There are no aggressive appearing lytic or blastic lesions noted in the visualized portions of the skeleton.  IMPRESSION: 1. Findings, as above, concerning for acute cholecystitis, likely related to the presence of biliary sludge in the gallbladder. Surgical consultation is  recommended. 2. Colonic diverticulosis without findings to suggest acute diverticulitis at this time. 3. Normal appendix. 4. Extensive atherosclerosis, including at least 3 vessel coronary artery disease. 5. Small hiatal hernia. 6. Additional incidental findings, as above. These results were called by telephone at the time of interpretation on 02/11/2014 at 5:56 pm to Dr. Shirlyn Goltz, who verbally acknowledged these results.   Electronically Signed   By: Vinnie Langton M.D.   On: 02/11/2014 17:57     CBC  Recent Labs Lab 02/11/14 1615 02/12/14 0728 02/13/14 0500 02/14/14 0458  WBC 10.9* 11.9* 10.6* 14.8*  HGB 12.9* 12.9* 11.7* 11.2*  HCT 36.8* 36.4* 33.6* 31.9*  PLT 145* 159 145* 165  MCV 97.4 96.3 97.1 100.3*  MCH 34.1* 34.1* 33.8 35.2*  MCHC 35.1 35.4 34.8 35.1  RDW 13.8 13.5 13.6 13.9  LYMPHSABS 1.6  --   --   --   MONOABS 1.2*  --   --   --   EOSABS 0.1  --   --   --   BASOSABS 0.0  --   --   --     Chemistries   Recent Labs Lab 02/11/14 1615 02/12/14 0728 02/13/14 0500 02/14/14 0458  NA 134* 136* 137 137  K 4.0 3.3* 3.5* 3.9  CL 97 102 103 102  CO2 24 21 20 19   GLUCOSE 113* 127* 127* 127*  BUN 28* 23 18 14   CREATININE 1.20 0.91 0.93 0.99  CALCIUM 8.8 8.5 8.0* 7.5*  MG  --   --  1.6  --   AST 41* 30 27 51*  ALT 69* 55* 41 42  ALKPHOS 139* 144* 129* 124*  BILITOT 4.3* 3.6* 2.9* 2.1*   ------------------------------------------------------------------------------------------------------------------ estimated creatinine clearance is 62.3 ml/min (by C-G formula based on Cr of 0.99). ------------------------------------------------------------------------------------------------------------------ No results found for this basename: HGBA1C,  in the last 72 hours ------------------------------------------------------------------------------------------------------------------ No results found for this basename: CHOL, HDL, LDLCALC, TRIG, CHOLHDL, LDLDIRECT,  in the last  72 hours ------------------------------------------------------------------------------------------------------------------ No results found for this basename: TSH, T4TOTAL, FREET3, T3FREE, THYROIDAB,  in the last 72 hours ------------------------------------------------------------------------------------------------------------------ No results found for this basename: VITAMINB12, FOLATE, FERRITIN, TIBC, IRON, RETICCTPCT,  in the last 72 hours  Coagulation profile  Recent Labs Lab 02/12/14 0728  INR 1.07    No results found for this basename: DDIMER,  in the last 72 hours  Cardiac Enzymes No results found for this basename: CK, CKMB, TROPONINI, MYOGLOBIN,  in the last 168 hours ------------------------------------------------------------------------------------------------------------------ No components found with this basename: POCBNP,      Time Spent in minutes   35   Charlean Carneal K M.D on 02/14/2014 at 9:59 AM  Between 7am to 7pm - Pager - [phone]  After 7pm go to [email or web address] - password [email or web address]  And look for the night coverage person covering for me after hours  [facility] Office  [phone]

## 2014-02-15 LAB — COMPREHENSIVE METABOLIC PANEL
ALT: 35 U/L (ref 0–53)
ANION GAP: 15 (ref 5–15)
AST: 43 U/L — ABNORMAL HIGH (ref 0–37)
Albumin: 2.3 g/dL — ABNORMAL LOW (ref 3.5–5.2)
Alkaline Phosphatase: 145 U/L — ABNORMAL HIGH (ref 39–117)
BUN: 15 mg/dL (ref 6–23)
CO2: 22 mEq/L (ref 19–32)
CREATININE: 1.27 mg/dL (ref 0.50–1.35)
Calcium: 7.9 mg/dL — ABNORMAL LOW (ref 8.4–10.5)
Chloride: 100 mEq/L (ref 96–112)
GFR calc non Af Amer: 51 mL/min — ABNORMAL LOW (ref >90)
GFR, EST AFRICAN AMERICAN: 59 mL/min — AB (ref >90)
Glucose, Bld: 120 mg/dL — ABNORMAL HIGH (ref 70–99)
Potassium: 3.8 mEq/L (ref 3.7–5.3)
SODIUM: 137 meq/L (ref 137–147)
TOTAL PROTEIN: 5.5 g/dL — AB (ref 6.0–8.3)
Total Bilirubin: 1.8 mg/dL — ABNORMAL HIGH (ref 0.3–1.2)

## 2014-02-15 LAB — GLUCOSE, CAPILLARY
GLUCOSE-CAPILLARY: 129 mg/dL — AB (ref 70–99)
Glucose-Capillary: 114 mg/dL — ABNORMAL HIGH (ref 70–99)
Glucose-Capillary: 132 mg/dL — ABNORMAL HIGH (ref 70–99)

## 2014-02-15 LAB — CBC
HCT: 32.3 % — ABNORMAL LOW (ref 39.0–52.0)
HEMOGLOBIN: 11.2 g/dL — AB (ref 13.0–17.0)
MCH: 33.9 pg (ref 26.0–34.0)
MCHC: 34.7 g/dL (ref 30.0–36.0)
MCV: 97.9 fL (ref 78.0–100.0)
PLATELETS: 163 10*3/uL (ref 150–400)
RBC: 3.3 MIL/uL — AB (ref 4.22–5.81)
RDW: 13.7 % (ref 11.5–15.5)
WBC: 14.4 10*3/uL — AB (ref 4.0–10.5)

## 2014-02-15 LAB — URINE CULTURE
COLONY COUNT: NO GROWTH
Culture: NO GROWTH

## 2014-02-15 LAB — MAGNESIUM: MAGNESIUM: 1.6 mg/dL (ref 1.5–2.5)

## 2014-02-15 MED ORDER — MORPHINE SULFATE 2 MG/ML IJ SOLN: 2.0000 mg | INTRAMUSCULAR | Status: DC | PRN

## 2014-02-15 NOTE — Evaluation (Signed)
Physical Therapy Evaluation and Discharge  Patient Details Name: Arthur Mcdaniel MRN: [id number] DOB: 1930-12-02 Today's Date: 02/15/2014   History of Present Illness  Pt is a 80 y.o. male s/p laparoscopic cholecystectomy by general surgery on 02/13/2014, has a drain in place,   Clinical Impression  Patient evaluated by Physical Therapy with no further acute PT needs identified. Pt scored 21 on DGI, demo good balance with activities and good safety awareness. All education has been completed and the patient has no further questions. Pt encouraged to ambulate unit 3x daily around unit. See below for any follow-up Physial Therapy or equipment needs. PT is signing off. Thank you for this referral.     Follow Up Recommendations No PT follow up    Equipment Recommendations  None recommended by PT    Recommendations for Other Services       Precautions / Restrictions Precautions Precautions: None Restrictions Weight Bearing Restrictions: No      Mobility  Bed Mobility Overal bed mobility: Modified Independent             General bed mobility comments: demo good technique with incr time  Transfers Overall transfer level: Modified independent Equipment used: None             General transfer comment: no sway or LOB noted  Ambulation/Gait Ambulation/Gait assistance: Modified independent (Device/Increase time) Ambulation Distance (Feet): 400 Feet Assistive device: None Gait Pattern/deviations: WFL(Within Functional Limits) Gait velocity: at baseline  Gait velocity interpretation: at or above normal speed for age/gender General Gait Details: pt challenged with high level balance activities; see DGI; demo good balance and safety awareness; at baseline   Stairs Stairs: Yes Stairs assistance: Modified independent (Device/Increase time) Stair Management: One rail Left;Alternating pattern;Forwards Number of Stairs: 4 General stair comments: demo good technique; min cues  initialy to slow down for safety; no LOB and progressed to mod i  Wheelchair Mobility    Modified Rankin (Stroke Patients Only)       Balance Overall balance assessment: Modified Independent (see DGI)                           High level balance activites:  (see dgi) High Level Balance Comments: pt also able to pick objects off ground with incr time due to incision Standardized Balance Assessment Standardized Balance Assessment : Dynamic Gait Index   Dynamic Gait Index Level Surface: Normal Change in Gait Speed: Normal Gait with Horizontal Head Turns: Mild Impairment Gait with Vertical Head Turns: Normal Gait and Pivot Turn: Normal Step Over Obstacle: Mild Impairment Step Around Obstacles: Normal Steps: Mild Impairment Total Score: 21       Pertinent Vitals/Pain Pain Assessment: No/denies pain    Home Living Family/patient expects to be discharged to:: Private residence Living Arrangements: Spouse/significant other Available Help at Discharge: Family;Available 24 hours/day Type of Home: House Home Access: Stairs to enter Entrance Stairs-Rails: Left;Right;Can reach both Entrance Stairs-Number of Steps: 2 Home Layout: Two level Home Equipment: None Additional Comments: pt has walk in shower    Prior Function Level of Independence: Independent         Comments: very active; goes to [facility] 5x week; cuts down trees regularly     Hand Dominance        Extremity/Trunk Assessment   Upper Extremity Assessment: Overall WFL for tasks assessed           Lower Extremity Assessment: Overall WFL for tasks assessed  Cervical / Trunk Assessment: Normal  Communication   Communication: No difficulties  Cognition Arousal/Alertness: Awake/alert Behavior During Therapy: WFL for tasks assessed/performed Overall Cognitive Status: Within Functional Limits for tasks assessed                      General Comments General comments (skin  integrity, edema, etc.): pt with questions regarding neuropathy in feet; recommend pt see podiatrist or PCP for managment     Exercises        Assessment/Plan    PT Assessment Patent does not need any further PT services  PT Diagnosis Generalized weakness   PT Problem List    PT Treatment Interventions     PT Goals (Current goals can be found in the Care Plan section) Acute Rehab PT Goals Patient Stated Goal: home tomorrow PT Goal Formulation: No goals set, d/c therapy    Frequency     Barriers to discharge        Co-evaluation               End of Session Equipment Utilized During Treatment: Gait belt Activity Tolerance: Patient tolerated treatment well Patient left: in bed;with call bell/phone within reach;with family/visitor present Nurse Communication: Mobility status         Time: 8413-2440 PT Time Calculation (min): 17 min   Charges:   PT Evaluation $Initial PT Evaluation Tier I: 1 Procedure PT Treatments $Gait Training: 8-22 mins   PT G CodesGustavus Bryant, [place]  [phone] 02/15/2014, 4:12 PM

## 2014-02-15 NOTE — Progress Notes (Signed)
Patient ID: Arthur Mcdaniel, male   DOB: [birth year], 80 y.o.   MRN: [id number] [facility] Progress Note:   2 Days Post-Op  Subjective: Mental status is clear.  Cheerful and feeling better.   Objective: Vital signs in last 24 hours: Temp:  [97.7 F (36.5 C)-98.3 F (36.8 C)] 97.7 F (36.5 C) (10/10 0603) Pulse Rate:  [75-86] 81 (10/10 0603) Resp:  [16] 16 (10/10 0603) BP: (110-130)/(55-60) 130/55 mmHg (10/10 0603) SpO2:  [94 %-95 %] 95 % (10/10 0603) Weight:  [194 lb 3.2 oz (88.089 kg)] 194 lb 3.2 oz (88.089 kg) (10/10 0603)  Intake/Output from previous day: 10/09 0701 - 10/10 0700 In: 500 [I.V.:350; IV Piggyback:150] Out: 155 [Urine:130; Drains:25] Intake/Output this shift:    Physical Exam: Work of breathing is normal.  JP is serous.  No abdominal pain.  Appetite is good  Lab Results:  Results for orders placed during the hospital encounter of 02/11/14 (from the past 48 hour(s))  GLUCOSE, CAPILLARY     Status: Abnormal   Collection Time    02/13/14 11:38 AM      Result Value Ref Range   Glucose-Capillary 119 (*) 70 - 99 mg/dL  SURGICAL PCR SCREEN     Status: None   Collection Time    02/13/14 12:32 PM      Result Value Ref Range   MRSA, PCR NEGATIVE  NEGATIVE   Staphylococcus aureus NEGATIVE  NEGATIVE   Comment:            The Xpert SA Assay (FDA     approved for NASAL specimens     in patients over 57 years of age),     is one component of     a comprehensive surveillance     program.  Test performance has     been validated by [employer] for patients greater     than or equal to 28 year old.     It is not intended     to diagnose infection nor to     guide or monitor treatment.  GLUCOSE, CAPILLARY     Status: Abnormal   Collection Time    02/13/14  2:40 PM      Result Value Ref Range   Glucose-Capillary 116 (*) 70 - 99 mg/dL  GLUCOSE, CAPILLARY     Status: Abnormal   Collection Time    02/13/14  5:22 PM      Result Value Ref Range   Glucose-Capillary 148 (*) 70 - 99 mg/dL   Comment 1 Documented in Chart     Comment 2 Notify RN    GLUCOSE, CAPILLARY     Status: Abnormal   Collection Time    02/13/14 11:46 PM      Result Value Ref Range   Glucose-Capillary 138 (*) 70 - 99 mg/dL  COMPREHENSIVE METABOLIC PANEL     Status: Abnormal   Collection Time    02/14/14  4:58 AM      Result Value Ref Range   Sodium 137  137 - 147 mEq/L   Potassium 3.9  3.7 - 5.3 mEq/L   Chloride 102  96 - 112 mEq/L   CO2 19  19 - 32 mEq/L   Glucose, Bld 127 (*) 70 - 99 mg/dL   BUN 14  6 - 23 mg/dL   Creatinine, Ser 0.99  0.50 - 1.35 mg/dL   Calcium 7.5 (*) 8.4 - 10.5 mg/dL   Total Protein  5.3 (*) 6.0 - 8.3 g/dL   Albumin 2.3 (*) 3.5 - 5.2 g/dL   AST 51 (*) 0 - 37 U/L   ALT 42  0 - 53 U/L   Alkaline Phosphatase 124 (*) 39 - 117 U/L   Total Bilirubin 2.1 (*) 0.3 - 1.2 mg/dL   GFR calc non Af Amer 75 (*) >90 mL/min   GFR calc Af Amer 87 (*) >90 mL/min   Comment: (NOTE)     The eGFR has been calculated using the CKD EPI equation.     This calculation has not been validated in all clinical situations.     eGFR's persistently <90 mL/min signify possible Chronic Kidney     Disease.   Anion gap 16 (*) 5 - 15  CBC     Status: Abnormal   Collection Time    02/14/14  4:58 AM      Result Value Ref Range   WBC 14.8 (*) 4.0 - 10.5 K/uL   RBC 3.18 (*) 4.22 - 5.81 MIL/uL   Hemoglobin 11.2 (*) 13.0 - 17.0 g/dL   HCT 31.9 (*) 39.0 - 52.0 %   MCV 100.3 (*) 78.0 - 100.0 fL   MCH 35.2 (*) 26.0 - 34.0 pg   MCHC 35.1  30.0 - 36.0 g/dL   RDW 13.9  11.5 - 15.5 %   Platelets 165  150 - 400 K/uL  GLUCOSE, CAPILLARY     Status: Abnormal   Collection Time    02/14/14  6:16 AM      Result Value Ref Range   Glucose-Capillary 123 (*) 70 - 99 mg/dL  GLUCOSE, CAPILLARY     Status: Abnormal   Collection Time    02/14/14 12:14 PM      Result Value Ref Range   Glucose-Capillary 139 (*) 70 - 99 mg/dL  URINALYSIS, ROUTINE W REFLEX MICROSCOPIC     Status:  Abnormal   Collection Time    02/14/14  2:19 PM      Result Value Ref Range   Color, Urine AMBER (*) YELLOW   Comment: BIOCHEMICALS MAY BE AFFECTED BY COLOR   APPearance CLEAR  CLEAR   Specific Gravity, Urine 1.016  1.005 - 1.030   pH 5.5  5.0 - 8.0   Glucose, UA NEGATIVE  NEGATIVE mg/dL   Hgb urine dipstick NEGATIVE  NEGATIVE   Bilirubin Urine NEGATIVE  NEGATIVE   Ketones, ur 15 (*) NEGATIVE mg/dL   Protein, ur NEGATIVE  NEGATIVE mg/dL   Urobilinogen, UA 1.0  0.0 - 1.0 mg/dL   Nitrite NEGATIVE  NEGATIVE   Leukocytes, UA NEGATIVE  NEGATIVE   Comment: MICROSCOPIC NOT DONE ON URINES WITH NEGATIVE PROTEIN, BLOOD, LEUKOCYTES, NITRITE, OR GLUCOSE <1000 mg/dL.  GLUCOSE, CAPILLARY     Status: Abnormal   Collection Time    02/14/14 11:54 PM      Result Value Ref Range   Glucose-Capillary 119 (*) 70 - 99 mg/dL  COMPREHENSIVE METABOLIC PANEL     Status: Abnormal   Collection Time    02/15/14  3:15 AM      Result Value Ref Range   Sodium 137  137 - 147 mEq/L   Potassium 3.8  3.7 - 5.3 mEq/L   Chloride 100  96 - 112 mEq/L   CO2 22  19 - 32 mEq/L   Glucose, Bld 120 (*) 70 - 99 mg/dL   BUN 15  6 - 23 mg/dL   Creatinine, Ser 1.27  0.50 - 1.35  mg/dL   Calcium 7.9 (*) 8.4 - 10.5 mg/dL   Total Protein 5.5 (*) 6.0 - 8.3 g/dL   Albumin 2.3 (*) 3.5 - 5.2 g/dL   AST 43 (*) 0 - 37 U/L   ALT 35  0 - 53 U/L   Alkaline Phosphatase 145 (*) 39 - 117 U/L   Total Bilirubin 1.8 (*) 0.3 - 1.2 mg/dL   GFR calc non Af Amer 51 (*) >90 mL/min   GFR calc Af Amer 59 (*) >90 mL/min   Comment: (NOTE)     The eGFR has been calculated using the CKD EPI equation.     This calculation has not been validated in all clinical situations.     eGFR's persistently <90 mL/min signify possible Chronic Kidney     Disease.   Anion gap 15  5 - 15  CBC     Status: Abnormal   Collection Time    02/15/14  3:15 AM      Result Value Ref Range   WBC 14.4 (*) 4.0 - 10.5 K/uL   RBC 3.30 (*) 4.22 - 5.81 MIL/uL   Hemoglobin  11.2 (*) 13.0 - 17.0 g/dL   HCT 32.3 (*) 39.0 - 52.0 %   MCV 97.9  78.0 - 100.0 fL   MCH 33.9  26.0 - 34.0 pg   MCHC 34.7  30.0 - 36.0 g/dL   RDW 13.7  11.5 - 15.5 %   Platelets 163  150 - 400 K/uL  MAGNESIUM     Status: None   Collection Time    02/15/14  3:15 AM      Result Value Ref Range   Magnesium 1.6  1.5 - 2.5 mg/dL  GLUCOSE, CAPILLARY     Status: Abnormal   Collection Time    02/15/14  6:05 AM      Result Value Ref Range   Glucose-Capillary 114 (*) 70 - 99 mg/dL    Radiology/Results: Dg Chest Port 1 View  02/14/2014   CLINICAL DATA:  Initial encounter for Cough and fever beginning last night. Recent cholecystectomy.  EXAM: PORTABLE CHEST - 1 VIEW  COMPARISON:  07/10/2006  FINDINGS: 0746 hrs. Low volume film with vascular congestion and interstitial pulmonary edema. There is subsegmental atelectasis at the right base. The cardio pericardial silhouette is enlarged. Imaged bony structures of the thorax are intact.  IMPRESSION: Cardiomegaly with interstitial pulmonary edema.   Electronically Signed   By: Misty Stanley M.D.   On: 02/14/2014 07:57    Anti-infectives: Anti-infectives   Start     Dose/Rate Route Frequency Ordered Stop   02/14/14 0745  piperacillin-tazobactam (ZOSYN) IVPB 3.375 g     3.375 g 12.5 mL/hr over 240 Minutes Intravenous 3 times per day 02/14/14 0735     02/13/14 1400  ceFAZolin (ANCEF) IVPB 1 g/50 mL premix  Status:  Discontinued     1 g 100 mL/hr over 30 Minutes Intravenous 3 times per day 02/13/14 0947 02/14/14 0735   02/12/14 1400  Ampicillin-Sulbactam (UNASYN) 3 g in sodium chloride 0.9 % 100 mL IVPB  Status:  Discontinued     3 g 100 mL/hr over 60 Minutes Intravenous Every 6 hours 02/12/14 1347 02/13/14 0947      Assessment/Plan: Problem List: Patient Active Problem List   Diagnosis Date Noted  . Cognitive changes 02/12/2014  . Choledocholithiasis 02/11/2014  . Choledocholithiasis with cholecystitis 02/11/2014  . CAD (coronary artery  disease) 01/20/2011  . PAF (paroxysmal atrial fibrillation) 01/20/2011  .  HTN (hypertension) 01/20/2011  . Hyperlipidemia 01/20/2011    LFTs are still elevated and WBC hasn't come back to normal.  Urine is clearing.   2 Days Post-Op    LOS: 4 days   Matt B. Hassell Done, MD, [facility] [phone] beeper [phone]  02/15/2014 9:53 AM

## 2014-02-15 NOTE — Progress Notes (Signed)
Patient Demographics  Arthur Mcdaniel, is a 81 y.o. male, DOB - [birth year], ALP:[id number]  Admit date - 02/11/2014   Admitting Physician Berle Mull, MD  Outpatient Primary MD for the patient is Horatio Pel, MD  LOS - 4   Chief Complaint  Patient presents with  . Abnormal Lab    WBC up        Subjective:   Arthur Mcdaniel today has, No headache, No chest pain, minimal RUQ abdominal pain - No Nausea, No new weakness tingling or numbness, No Cough - SOB.   Assessment & Plan    1. Acute cholecystitis - likely due to biliary sludge, MRCP stable, seen by GI and general surgery following,  Post laparoscopic cholecystectomy by general surgery on 02/13/2014, has a drain in place and continues to be on Zosyn. Monitor cultures. Now on regular diet and passing flatus, surgery following he still has mild reactive leukocytosis LFTs and bilirubin trending down.     2. History of CAD/PAF. No acute issues, pain free, currently in sinus rhythm on EKG with no acute changes - continue beta blocker tolerated the surgery well.     3.HTN. Blood pressure stable continue oral beta blocker.     4. Gout. Continue allopurinol.     5. DM type II. Currently on sliding scale.  CBG (last 3)   Recent Labs  02/14/14 1214 02/14/14 2354 02/15/14 0605  GLUCAP 139* 119* 114*     6. Low potassium. Replace and stable.       Code Status: Full  Family Communication: Wife bedside  Disposition Plan: Home   Procedures CT scan abdomen pelvis, MRCP, laparoscopic cholecystectomy 02/13/2014   Consults  GI, general surgery   Medications  Scheduled Meds: . allopurinol  300 mg Oral Daily  . aspirin EC  81 mg Oral Daily  . heparin  5,000 Units Subcutaneous 3 times per day  . insulin aspart   0-9 Units Subcutaneous Q6H  . levothyroxine  125 mcg Oral QAC breakfast  . metoprolol tartrate  25 mg Oral BID  . pantoprazole  40 mg Oral Daily  . piperacillin-tazobactam (ZOSYN)  IV  3.375 g Intravenous 3 times per day   Continuous Infusions:   PRN Meds:.acetaminophen, metoprolol, morphine injection, nitroGLYCERIN, ondansetron (ZOFRAN) IV  DVT Prophylaxis  Lovenox    Lab Results  Component Value Date   PLT 163 02/15/2014    Antibiotics     Anti-infectives   Start     Dose/Rate Route Frequency Ordered Stop   02/14/14 0745  piperacillin-tazobactam (ZOSYN) IVPB 3.375 g     3.375 g 12.5 mL/hr over 240 Minutes Intravenous 3 times per day 02/14/14 0735     02/13/14 1400  ceFAZolin (ANCEF) IVPB 1 g/50 mL premix  Status:  Discontinued     1 g 100 mL/hr over 30 Minutes Intravenous 3 times per day 02/13/14 0947 02/14/14 0735   02/12/14 1400  Ampicillin-Sulbactam (UNASYN) 3 g in sodium chloride 0.9 % 100 mL IVPB  Status:  Discontinued     3 g 100 mL/hr over 60 Minutes Intravenous Every 6 hours 02/12/14 1347 02/13/14 0947          Objective:   Filed Vitals:   02/14/14 0830 02/14/14 1400 02/14/14  2208 02/15/14 0603  BP: 109/52 110/58 117/60 130/55  Pulse: 77 75 86 81  Temp: 99.1 F (37.3 C) 98 F (36.7 C) 98.3 F (36.8 C) 97.7 F (36.5 C)  TempSrc: Oral Oral Oral Oral  Resp: 16 16 16 16   Height:      Weight:    88.089 kg (194 lb 3.2 oz)  SpO2: 96% 94% 94% 95%    Wt Readings from Last 3 Encounters:  02/15/14 88.089 kg (194 lb 3.2 oz)  02/15/14 88.089 kg (194 lb 3.2 oz)  12/19/13 89.132 kg (196 lb 8 oz)     Intake/Output Summary (Last 24 hours) at 02/15/14 1011 Last data filed at 02/15/14 5176  Gross per 24 hour  Intake    500 ml  Output    155 ml  Net    345 ml     Physical Exam  Awake Alert, Oriented X 3, No new F.N deficits, Normal affect [place].AT,PERRAL Supple Neck,No JVD, No cervical lymphadenopathy appriciated.  Symmetrical Chest wall movement, Good  air movement bilaterally, CTAB RRR,No Gallops,Rubs or new Murmurs, No Parasternal Heave +ve B.Sounds, Abd Soft, right upper quadrant JP drain, No organomegaly appriciated, No rebound - guarding or rigidity. No Cyanosis, Clubbing or edema, No new Rash or bruise     Data Review   Micro Results Recent Results (from the past 240 hour(s))  SURGICAL PCR SCREEN     Status: None   Collection Time    02/13/14 12:32 PM      Result Value Ref Range Status   MRSA, PCR NEGATIVE  NEGATIVE Final   Staphylococcus aureus NEGATIVE  NEGATIVE Final   Comment:            The Xpert SA Assay (FDA     approved for NASAL specimens     in patients over 3 years of age),     is one component of     a comprehensive surveillance     program.  Test performance has     been validated by [employer] for patients greater     than or equal to 71 year old.     It is not intended     to diagnose infection nor to     guide or monitor treatment.    Radiology Reports Ct Abdomen Pelvis W Contrast  02/11/2014   CLINICAL DATA:  Initial evaluation of 80 year old male presenting with nausea and diffuse abdominal pain for the past several days. Intermittent vomiting. White blood cell count mildly elevated.  EXAM: CT ABDOMEN AND PELVIS WITH CONTRAST  TECHNIQUE: Multidetector CT imaging of the abdomen and pelvis was performed using the standard protocol following bolus administration of intravenous contrast.  CONTRAST:  60mL OMNIPAQUE IOHEXOL 300 MG/ML  SOLN  COMPARISON:  No priors.  FINDINGS: Lower chest: Mild scarring in the lung bases bilaterally. Mild cardiomegaly. Atherosclerotic calcifications in the left anterior descending, left circumflex and right coronary arteries. Small hiatal hernia.  Hepatobiliary: Gallbladder appears moderately distended, however, the gallbladder wall appears diffusely thickened measuring up to 7 mm, and there is slight haziness in the surrounding fat, suggesting inflammation. No definite  calcified gallstones are identified. There does appear to be some intermediate attenuation material layering dependently in the gallbladder, which could indicate the presence of biliary sludge. The appearance of the liver is normal. No intra or extrahepatic biliary ductal dilatation.  Pancreas: Mild fatty atrophy, but otherwise normal in appearance.  Spleen: Unremarkable.  Adrenals/Urinary Tract: Normal  appearance of the adrenal glands bilaterally. The kidneys are also normal in appearance bilaterally. Mild bilateral perinephric stranding (nonspecific). No hydroureteronephrosis. Urinary bladder is normal in appearance.  Stomach/Bowel: The appearance of the stomach is normal. No pathologic dilatation of the small bowel or colon. Numerous colonic diverticulae are noted, most pronounced in the distal descending colon and sigmoid colon, without surrounding inflammatory changes to suggest an acute diverticulitis at this time. Normal appendix. Duodenal diverticulum from the second portion of the duodenum in the region of the pancreatic head incidentally noted.  Vascular/Lymphatic: Extensive atherosclerosis throughout the abdominal and pelvic vasculature, without evidence of aneurysm or dissection. No pathologically enlarged lymph nodes are identified in the abdomen or pelvis. Numerous reactive size retroperitoneal lymph nodes are incidentally noted, but nonspecific  Reproductive: Prostate gland is unremarkable in appearance.  Other: No significant volume of ascites.  No pneumoperitoneum.  Musculoskeletal: Multilevel degenerative disc disease, most severe at L2-L3 and L5-S1. Bilateral pars defects at L5 with 9 mm of anterolisthesis of L5 upon S1. There are no aggressive appearing lytic or blastic lesions noted in the visualized portions of the skeleton.  IMPRESSION: 1. Findings, as above, concerning for acute cholecystitis, likely related to the presence of biliary sludge in the gallbladder. Surgical consultation is  recommended. 2. Colonic diverticulosis without findings to suggest acute diverticulitis at this time. 3. Normal appendix. 4. Extensive atherosclerosis, including at least 3 vessel coronary artery disease. 5. Small hiatal hernia. 6. Additional incidental findings, as above. These results were called by telephone at the time of interpretation on 02/11/2014 at 5:56 pm to Dr. Shirlyn Goltz, who verbally acknowledged these results.   Electronically Signed   By: Vinnie Langton M.D.   On: 02/11/2014 17:57     CBC  Recent Labs Lab 02/11/14 1615 02/12/14 0728 02/13/14 0500 02/14/14 0458 02/15/14 0315  WBC 10.9* 11.9* 10.6* 14.8* 14.4*  HGB 12.9* 12.9* 11.7* 11.2* 11.2*  HCT 36.8* 36.4* 33.6* 31.9* 32.3*  PLT 145* 159 145* 165 163  MCV 97.4 96.3 97.1 100.3* 97.9  MCH 34.1* 34.1* 33.8 35.2* 33.9  MCHC 35.1 35.4 34.8 35.1 34.7  RDW 13.8 13.5 13.6 13.9 13.7  LYMPHSABS 1.6  --   --   --   --   MONOABS 1.2*  --   --   --   --   EOSABS 0.1  --   --   --   --   BASOSABS 0.0  --   --   --   --     Chemistries   Recent Labs Lab 02/11/14 1615 02/12/14 0728 02/13/14 0500 02/14/14 0458 02/15/14 0315  NA 134* 136* 137 137 137  K 4.0 3.3* 3.5* 3.9 3.8  CL 97 102 103 102 100  CO2 24 21 20 19 22   GLUCOSE 113* 127* 127* 127* 120*  BUN 28* 23 18 14 15   CREATININE 1.20 0.91 0.93 0.99 1.27  CALCIUM 8.8 8.5 8.0* 7.5* 7.9*  MG  --   --  1.6  --  1.6  AST 41* 30 27 51* 43*  ALT 69* 55* 41 42 35  ALKPHOS 139* 144* 129* 124* 145*  BILITOT 4.3* 3.6* 2.9* 2.1* 1.8*   ------------------------------------------------------------------------------------------------------------------ estimated creatinine clearance is 48.6 ml/min (by C-G formula based on Cr of 1.27). ------------------------------------------------------------------------------------------------------------------ No results found for this basename: HGBA1C,  in the last 72  hours ------------------------------------------------------------------------------------------------------------------ No results found for this basename: CHOL, HDL, LDLCALC, TRIG, CHOLHDL, LDLDIRECT,  in the last 72 hours ------------------------------------------------------------------------------------------------------------------ No results  found for this basename: TSH, T4TOTAL, FREET3, T3FREE, THYROIDAB,  in the last 72 hours ------------------------------------------------------------------------------------------------------------------ No results found for this basename: VITAMINB12, FOLATE, FERRITIN, TIBC, IRON, RETICCTPCT,  in the last 72 hours  Coagulation profile  Recent Labs Lab 02/12/14 0728  INR 1.07    No results found for this basename: DDIMER,  in the last 72 hours  Cardiac Enzymes No results found for this basename: CK, CKMB, TROPONINI, MYOGLOBIN,  in the last 168 hours ------------------------------------------------------------------------------------------------------------------ No components found with this basename: POCBNP,      Time Spent in minutes   35   Jessup Ogas K M.D on 02/15/2014 at 10:11 AM  Between 7am to 7pm - Pager - [phone]  After 7pm go to [email or web address] - password [email or web address]  And look for the night coverage person covering for me after hours  [facility] Office  [phone]

## 2014-02-16 LAB — COMPREHENSIVE METABOLIC PANEL
ALT: 32 U/L (ref 0–53)
AST: 40 U/L — ABNORMAL HIGH (ref 0–37)
Albumin: 2.1 g/dL — ABNORMAL LOW (ref 3.5–5.2)
Alkaline Phosphatase: 142 U/L — ABNORMAL HIGH (ref 39–117)
Anion gap: 14 (ref 5–15)
BUN: 15 mg/dL (ref 6–23)
CALCIUM: 8 mg/dL — AB (ref 8.4–10.5)
CO2: 19 mEq/L (ref 19–32)
CREATININE: 1.13 mg/dL (ref 0.50–1.35)
Chloride: 104 mEq/L (ref 96–112)
GFR calc Af Amer: 68 mL/min — ABNORMAL LOW (ref >90)
GFR calc non Af Amer: 59 mL/min — ABNORMAL LOW (ref >90)
Glucose, Bld: 123 mg/dL — ABNORMAL HIGH (ref 70–99)
Potassium: 3.4 mEq/L — ABNORMAL LOW (ref 3.7–5.3)
Sodium: 137 mEq/L (ref 137–147)
TOTAL PROTEIN: 5.3 g/dL — AB (ref 6.0–8.3)
Total Bilirubin: 1.2 mg/dL (ref 0.3–1.2)

## 2014-02-16 LAB — GLUCOSE, CAPILLARY: GLUCOSE-CAPILLARY: 118 mg/dL — AB (ref 70–99)

## 2014-02-16 LAB — CBC WITH DIFFERENTIAL/PLATELET
BASOS PCT: 1 % (ref 0–1)
Basophils Absolute: 0.1 10*3/uL (ref 0.0–0.1)
Eosinophils Absolute: 0.3 10*3/uL (ref 0.0–0.7)
Eosinophils Relative: 3 % (ref 0–5)
HCT: 30.2 % — ABNORMAL LOW (ref 39.0–52.0)
HEMOGLOBIN: 10.5 g/dL — AB (ref 13.0–17.0)
Lymphocytes Relative: 19 % (ref 12–46)
Lymphs Abs: 1.6 10*3/uL (ref 0.7–4.0)
MCH: 34.4 pg — AB (ref 26.0–34.0)
MCHC: 34.8 g/dL (ref 30.0–36.0)
MCV: 99 fL (ref 78.0–100.0)
MONO ABS: 0.7 10*3/uL (ref 0.1–1.0)
Monocytes Relative: 9 % (ref 3–12)
NEUTROS PCT: 68 % (ref 43–77)
Neutro Abs: 5.6 10*3/uL (ref 1.7–7.7)
Platelets: 181 10*3/uL (ref 150–400)
RBC: 3.05 MIL/uL — ABNORMAL LOW (ref 4.22–5.81)
RDW: 13.8 % (ref 11.5–15.5)
WBC: 8.3 10*3/uL (ref 4.0–10.5)

## 2014-02-16 MED ORDER — HYDROCODONE-ACETAMINOPHEN 5-325 MG PO TABS: 1.0000 | ORAL_TABLET | Freq: Four times a day (QID) | ORAL | Status: DC | PRN

## 2014-02-16 MED ORDER — POTASSIUM CHLORIDE CRYS ER 20 MEQ PO TBCR
40.0000 meq | EXTENDED_RELEASE_TABLET | Freq: Once | ORAL | Status: AC
  Administered 2014-02-16: 40 meq via ORAL
  Filled 2014-02-16: qty 2

## 2014-02-16 MED ORDER — AMOXICILLIN-POT CLAVULANATE 875-125 MG PO TABS: 1.0000 | ORAL_TABLET | Freq: Two times a day (BID) | ORAL | Status: DC

## 2014-02-16 MED ORDER — MAGNESIUM SULFATE 40 MG/ML IJ SOLN
2.0000 g | Freq: Once | INTRAMUSCULAR | Status: AC
Start: 2014-02-16 — End: 2014-02-16
  Administered 2014-02-16: 2 g via INTRAVENOUS
  Filled 2014-02-16 (×2): qty 50

## 2014-02-16 NOTE — Progress Notes (Signed)
Patient ID: Arthur Mcdaniel, male   DOB: [birth year], 80 y.o.   MRN: [id number] [facility] Surgery Progress Note:   3 Days Post-Op  Subjective: Mental status is clear.  Feeling much better Objective: Vital signs in last 24 hours: Temp:  [97.2 F (36.2 C)-97.9 F (36.6 C)] 97.9 F (36.6 C) (10/11 0603) Pulse Rate:  [71-83] 71 (10/11 0603) Resp:  [17-19] 17 (10/11 0603) BP: (117-140)/(56-66) 124/64 mmHg (10/11 0603) SpO2:  [95 %-97 %] 96 % (10/11 0603) Weight:  [193 lb 12.8 oz (87.907 kg)] 193 lb 12.8 oz (87.907 kg) (10/11 0603)  Intake/Output from previous day: 10/10 0701 - 10/11 0700 In: 50 [IV Piggyback:50] Out: 20 [Drains:20] Intake/Output this shift:    Physical Exam: Work of breathing is normal.  JP is serous-removed.    Lab Results:  Results for orders placed during the hospital encounter of 02/11/14 (from the past 48 hour(s))  GLUCOSE, CAPILLARY     Status: Abnormal   Collection Time    02/14/14 12:14 PM      Result Value Ref Range   Glucose-Capillary 139 (*) 70 - 99 mg/dL  URINE CULTURE     Status: None   Collection Time    02/14/14  2:19 PM      Result Value Ref Range   Specimen Description URINE, CLEAN CATCH     Special Requests NONE     Culture  Setup Time       Value: 02/14/2014 20:39     Performed at [facility]       Value: NO GROWTH     Performed at [facility]   Culture       Value: NO GROWTH     Performed at [facility]   Report Status 02/15/2014 FINAL    URINALYSIS, ROUTINE W REFLEX MICROSCOPIC     Status: Abnormal   Collection Time    02/14/14  2:19 PM      Result Value Ref Range   Color, Urine AMBER (*) YELLOW   Comment: BIOCHEMICALS MAY BE AFFECTED BY COLOR   APPearance CLEAR  CLEAR   Specific Gravity, Urine 1.016  1.005 - 1.030   pH 5.5  5.0 - 8.0   Glucose, UA NEGATIVE  NEGATIVE mg/dL   Hgb urine dipstick NEGATIVE  NEGATIVE   Bilirubin Urine NEGATIVE  NEGATIVE   Ketones, ur 15 (*) NEGATIVE  mg/dL   Protein, ur NEGATIVE  NEGATIVE mg/dL   Urobilinogen, UA 1.0  0.0 - 1.0 mg/dL   Nitrite NEGATIVE  NEGATIVE   Leukocytes, UA NEGATIVE  NEGATIVE   Comment: MICROSCOPIC NOT DONE ON URINES WITH NEGATIVE PROTEIN, BLOOD, LEUKOCYTES, NITRITE, OR GLUCOSE <1000 mg/dL.  GLUCOSE, CAPILLARY     Status: Abnormal   Collection Time    02/14/14 11:54 PM      Result Value Ref Range   Glucose-Capillary 119 (*) 70 - 99 mg/dL  COMPREHENSIVE METABOLIC PANEL     Status: Abnormal   Collection Time    02/15/14  3:15 AM      Result Value Ref Range   Sodium 137  137 - 147 mEq/L   Potassium 3.8  3.7 - 5.3 mEq/L   Chloride 100  96 - 112 mEq/L   CO2 22  19 - 32 mEq/L   Glucose, Bld 120 (*) 70 - 99 mg/dL   BUN 15  6 - 23 mg/dL   Creatinine, Ser 1.27  0.50 - 1.35 mg/dL  Calcium 7.9 (*) 8.4 - 10.5 mg/dL   Total Protein 5.5 (*) 6.0 - 8.3 g/dL   Albumin 2.3 (*) 3.5 - 5.2 g/dL   AST 43 (*) 0 - 37 U/L   ALT 35  0 - 53 U/L   Alkaline Phosphatase 145 (*) 39 - 117 U/L   Total Bilirubin 1.8 (*) 0.3 - 1.2 mg/dL   GFR calc non Af Amer 51 (*) >90 mL/min   GFR calc Af Amer 59 (*) >90 mL/min   Comment: (NOTE)     The eGFR has been calculated using the CKD EPI equation.     This calculation has not been validated in all clinical situations.     eGFR's persistently <90 mL/min signify possible Chronic Kidney     Disease.   Anion gap 15  5 - 15  CBC     Status: Abnormal   Collection Time    02/15/14  3:15 AM      Result Value Ref Range   WBC 14.4 (*) 4.0 - 10.5 K/uL   RBC 3.30 (*) 4.22 - 5.81 MIL/uL   Hemoglobin 11.2 (*) 13.0 - 17.0 g/dL   HCT 32.3 (*) 39.0 - 52.0 %   MCV 97.9  78.0 - 100.0 fL   MCH 33.9  26.0 - 34.0 pg   MCHC 34.7  30.0 - 36.0 g/dL   RDW 13.7  11.5 - 15.5 %   Platelets 163  150 - 400 K/uL  MAGNESIUM     Status: None   Collection Time    02/15/14  3:15 AM      Result Value Ref Range   Magnesium 1.6  1.5 - 2.5 mg/dL  GLUCOSE, CAPILLARY     Status: Abnormal   Collection Time     02/15/14  6:05 AM      Result Value Ref Range   Glucose-Capillary 114 (*) 70 - 99 mg/dL  GLUCOSE, CAPILLARY     Status: Abnormal   Collection Time    02/15/14 12:13 PM      Result Value Ref Range   Glucose-Capillary 129 (*) 70 - 99 mg/dL  GLUCOSE, CAPILLARY     Status: Abnormal   Collection Time    02/15/14  5:45 PM      Result Value Ref Range   Glucose-Capillary 132 (*) 70 - 99 mg/dL  COMPREHENSIVE METABOLIC PANEL     Status: Abnormal   Collection Time    02/16/14  4:47 AM      Result Value Ref Range   Sodium 137  137 - 147 mEq/L   Potassium 3.4 (*) 3.7 - 5.3 mEq/L   Chloride 104  96 - 112 mEq/L   CO2 19  19 - 32 mEq/L   Glucose, Bld 123 (*) 70 - 99 mg/dL   BUN 15  6 - 23 mg/dL   Creatinine, Ser 1.13  0.50 - 1.35 mg/dL   Calcium 8.0 (*) 8.4 - 10.5 mg/dL   Total Protein 5.3 (*) 6.0 - 8.3 g/dL   Albumin 2.1 (*) 3.5 - 5.2 g/dL   AST 40 (*) 0 - 37 U/L   ALT 32  0 - 53 U/L   Alkaline Phosphatase 142 (*) 39 - 117 U/L   Total Bilirubin 1.2  0.3 - 1.2 mg/dL   GFR calc non Af Amer 59 (*) >90 mL/min   GFR calc Af Amer 68 (*) >90 mL/min   Comment: (NOTE)     The eGFR has been calculated using the  CKD EPI equation.     This calculation has not been validated in all clinical situations.     eGFR's persistently <90 mL/min signify possible Chronic Kidney     Disease.   Anion gap 14  5 - 15  CBC WITH DIFFERENTIAL     Status: Abnormal   Collection Time    02/16/14  4:47 AM      Result Value Ref Range   WBC 8.3  4.0 - 10.5 K/uL   RBC 3.05 (*) 4.22 - 5.81 MIL/uL   Hemoglobin 10.5 (*) 13.0 - 17.0 g/dL   HCT 30.2 (*) 39.0 - 52.0 %   MCV 99.0  78.0 - 100.0 fL   MCH 34.4 (*) 26.0 - 34.0 pg   MCHC 34.8  30.0 - 36.0 g/dL   RDW 13.8  11.5 - 15.5 %   Platelets 181  150 - 400 K/uL   Neutrophils Relative % 68  43 - 77 %   Lymphocytes Relative 19  12 - 46 %   Monocytes Relative 9  3 - 12 %   Eosinophils Relative 3  0 - 5 %   Basophils Relative 1  0 - 1 %   Neutro Abs 5.6  1.7 - 7.7 K/uL    Lymphs Abs 1.6  0.7 - 4.0 K/uL   Monocytes Absolute 0.7  0.1 - 1.0 K/uL   Eosinophils Absolute 0.3  0.0 - 0.7 K/uL   Basophils Absolute 0.1  0.0 - 0.1 K/uL   Smear Review LARGE PLATELETS PRESENT    GLUCOSE, CAPILLARY     Status: Abnormal   Collection Time    02/16/14  5:56 AM      Result Value Ref Range   Glucose-Capillary 118 (*) 70 - 99 mg/dL   Comment 1 Notify RN     Comment 2 Documented in Chart      Radiology/Results: No results found.  Anti-infectives: Anti-infectives   Start     Dose/Rate Route Frequency Ordered Stop   02/16/14 0000  amoxicillin-clavulanate (AUGMENTIN) 875-125 MG per tablet     1 tablet Oral 2 times daily 02/16/14 0735     02/14/14 0745  piperacillin-tazobactam (ZOSYN) IVPB 3.375 g     3.375 g 12.5 mL/hr over 240 Minutes Intravenous 3 times per day 02/14/14 0735     02/13/14 1400  ceFAZolin (ANCEF) IVPB 1 g/50 mL premix  Status:  Discontinued     1 g 100 mL/hr over 30 Minutes Intravenous 3 times per day 02/13/14 0947 02/14/14 0735   02/12/14 1400  Ampicillin-Sulbactam (UNASYN) 3 g in sodium chloride 0.9 % 100 mL IVPB  Status:  Discontinued     3 g 100 mL/hr over 60 Minutes Intravenous Every 6 hours 02/12/14 1347 02/13/14 0947      Assessment/Plan: Problem List: Patient Active Problem List   Diagnosis Date Noted  . Cognitive changes 02/12/2014  . Choledocholithiasis 02/11/2014  . Choledocholithiasis with cholecystitis 02/11/2014  . CAD (coronary artery disease) 01/20/2011  . PAF (paroxysmal atrial fibrillation) 01/20/2011  . HTN (hypertension) 01/20/2011  . Hyperlipidemia 01/20/2011    Doing well -  Labs have normalized.  Ready for discharge from surgery standpoint.  3 Days Post-Op    LOS: 5 days   Matt B. Hassell Done, MD, [facility] [phone] beeper [phone]  02/16/2014 10:33 AM

## 2014-02-16 NOTE — Discharge Summary (Signed)
Arthur Mcdaniel, is a 80 y.o. male  DOB October 28, 1930  MRN [id number].  Admission date:  02/11/2014  Admitting Physician  Berle Mull, MD  Discharge Date:  02/16/2014   Primary MD  Horatio Pel, MD  Recommendations for primary care physician for things to follow:   Check CBC, CMP in 3 days.  Follow with general surgery in 3-4 days   Outpatient workup for CAD noticed on CT scan.   Admission Diagnosis  Choledocholithiasis [K80.50] Bilirubinemia [E80.6]   Discharge Diagnosis  Choledocholithiasis [K80.50] Bilirubinemia [E80.6]    Principal Problem:   Choledocholithiasis with cholecystitis Active Problems:   CAD (coronary artery disease)   PAF (paroxysmal atrial fibrillation)   HTN (hypertension)   Cognitive changes      Past Medical History  Diagnosis Date  . PAF (paroxysmal atrial fibrillation)   . Ischemic heart disease     Remote stent to LCX in 1997  . Gout   . Hypothyroidism   . Hyperlipidemia     Myalgias with Lipitor  . GERD (gastroesophageal reflux disease)   . Melanoma   . Memory deficit   . Detached retina   . Obese   . Normal nuclear stress test June 2012    Past Surgical History  Procedure Laterality Date  . Coronary stent placement  1997    LCX  . Cholecystectomy N/A 02/13/2014    Procedure: LAPAROSCOPIC CHOLECYSTECTOMY ;  Surgeon: Autumn Messing III, MD;  Location: [facility];  Service: General;  Laterality: N/A;       History of present illness and  Hospital Course:     Kindly see H&P for history of present illness and admission details, please review complete Labs, Consult reports and Test reports for all details in brief  HPI  from the history and physical done on the day of admission   Arthur Mcdaniel is a 80 y.o. male with Past medical history of A. fib, coronary artery disease,  hypothyroidism, GERD, gout in the past.  The patient presents with complaints of abnormal lab. He initially presented to his PCP for the complaint of nausea, and vomiting and decreased oral appetite that has been ongoing since last few days.  He also was complaining of right lower quadrant pain.  His PCP obtained initial workup and was found to have elevated LFT and therefore he was referred here for further workup.  He denies any fever or chills denies any diarrhea or constipation. Denies any pain in his abdomen with fatty meal.  He denies any changes in his medications.   Hospital Course    1. Acute cholecystitis - likely due to biliary sludge, MRCP stable, seen by GI and general surgery following, Post laparoscopic cholecystectomy by general surgery on 02/13/2014, has a drain in place , tolerating regular diet, leukocytosis and elevated liver numbers have trended down nicely, will switch from Zosyn to Augmentin for 5 more days discharge home if cleared by general surgery with outpatient general surgery followup.   2. History of CAD/PAF. No acute  issues, pain free, patient followup by PCP and primary cardiologist, CAD noted incidentally on CT scan as well.   3.HTN. Blood pressure stable continue home regimen.    4. Gout. Continue allopurinol.    5. DM type II.  is home regimen on discharge.    6. Low potassium. Replaced.      Discharge Condition: stable   Follow UP  Follow-up Information   Follow up with Horatio Pel, MD. Schedule an appointment as soon as possible for a visit in 3 days.   Specialty:  Internal Medicine   Contact information:   [address] [place] [address] [phone]       Follow up with [facility],MD, MD. Schedule an appointment as soon as possible for a visit in 3 days.   Specialty:  General Surgery        Discharge Instructions  and  Discharge Medications      Discharge Instructions   Diet - low sodium heart  healthy    Complete by:  As directed      Discharge instructions    Complete by:  As directed   Follow with Primary MD Horatio Pel, MD in 3 days   Get CBC, CMP, 2 view Chest X ray checked  by Primary MD next visit.    Activity: As tolerated with Full fall precautions use walker/cane & assistance as needed   Disposition Home     Diet: Heart Healthy Low Carb  For Heart failure patients - Check your Weight same time everyday, if you gain over 2 pounds, or you develop in leg swelling, experience more shortness of breath or chest pain, call your Primary MD immediately. Follow Cardiac Low Salt Diet and 1.8 lit/day fluid restriction.   On your next visit with her primary care physician please Get Medicines reviewed and adjusted.  Please request your Prim.MD to go over all Hospital Tests and Procedure/Radiological results at the follow up, please get all Hospital records sent to your Prim MD by signing hospital release before you go home.   If you experience worsening of your admission symptoms, develop shortness of breath, life threatening emergency, suicidal or homicidal thoughts you must seek medical attention immediately by calling 911 or calling your MD immediately  if symptoms less severe.  You Must read complete instructions/literature along with all the possible adverse reactions/side effects for all the Medicines you take and that have been prescribed to you. Take any new Medicines after you have completely understood and accpet all the possible adverse reactions/side effects.   Do not drive, operating heavy machinery, perform activities at heights, swimming or participation in water activities or provide baby sitting services if your were admitted for syncope or siezures until you have seen by Primary MD or a Neurologist and advised to do so again.  Do not drive when taking Pain medications.    Do not take more than prescribed Pain, Sleep and Anxiety  Medications  Special Instructions: If you have smoked or chewed Tobacco  in the last 2 yrs please stop smoking, stop any regular Alcohol  and or any Recreational drug use.  Wear Seat belts while driving.   Please note  You were cared for by a hospitalist during your hospital stay. If you have any questions about your discharge medications or the care you received while you were in the hospital after you are discharged, you can call the unit and asked to speak with the hospitalist on call if the hospitalist that  took care of you is not available. Once you are discharged, your primary care physician will handle any further medical issues. Please note that NO REFILLS for any discharge medications will be authorized once you are discharged, as it is imperative that you return to your primary care physician (or establish a relationship with a primary care physician if you do not have one) for your aftercare needs so that they can reassess your need for medications and monitor your lab values.     Increase activity slowly    Complete by:  As directed             Medication List         allopurinol 300 MG tablet  Commonly known as:  ZYLOPRIM  Take 300 mg by mouth daily.     amLODipine 2.5 MG tablet  Commonly known as:  NORVASC  Take 1 tablet (2.5 mg total) by mouth daily.     amoxicillin-clavulanate 875-125 MG per tablet  Commonly known as:  AUGMENTIN  Take 1 tablet by mouth 2 (two) times daily.     aspirin 81 MG tablet  Take 81 mg by mouth daily.     cycloSPORINE 0.05 % ophthalmic emulsion  Commonly known as:  RESTASIS  Place 1 drop into both eyes 2 (two) times daily.     esomeprazole 40 MG capsule  Commonly known as:  NEXIUM  Take 40 mg by mouth as needed.     fluticasone 50 MCG/ACT nasal spray  Commonly known as:  FLONASE  Place 2 sprays into the nose as needed.     HYDROcodone-acetaminophen 5-325 MG per tablet  Commonly known as:  NORCO/VICODIN  Take 1 tablet by mouth  every 6 (six) hours as needed for moderate pain.     Levothyroxine Sodium 125 MCG Caps  Take by mouth daily before breakfast.     metFORMIN 850 MG tablet  Commonly known as:  GLUCOPHAGE  Take 850 mg by mouth 2 (two) times daily with a meal.     nitroGLYCERIN 0.4 MG SL tablet  Commonly known as:  NITROSTAT  Place 1 tablet (0.4 mg total) under the tongue every 5 (five) minutes as needed.     simvastatin 20 MG tablet  Commonly known as:  ZOCOR  Take 20 mg by mouth at bedtime.     vardenafil 20 MG tablet  Commonly known as:  LEVITRA  Take 20 mg by mouth daily as needed.     vitamin B-12 1000 MCG tablet  Commonly known as:  CYANOCOBALAMIN  Take 1,000 mcg by mouth daily.          Diet and Activity recommendation: See Discharge Instructions above   Consults obtained - GI, [facility]   Major procedures and Radiology Reports - PLEASE review detailed and final reports for all details, in brief -       Ct Abdomen Pelvis W Contrast  02/11/2014   CLINICAL DATA:  Initial evaluation of 80 year old male presenting with nausea and diffuse abdominal pain for the past several days. Intermittent vomiting. White blood cell count mildly elevated.  EXAM: CT ABDOMEN AND PELVIS WITH CONTRAST  TECHNIQUE: Multidetector CT imaging of the abdomen and pelvis was performed using the standard protocol following bolus administration of intravenous contrast.  CONTRAST:  50mL OMNIPAQUE IOHEXOL 300 MG/ML  SOLN  COMPARISON:  No priors.  FINDINGS: Lower chest: Mild scarring in the lung bases bilaterally. Mild cardiomegaly. Atherosclerotic calcifications in the left anterior descending, left circumflex and right  coronary arteries. Small hiatal hernia.  Hepatobiliary: Gallbladder appears moderately distended, however, the gallbladder wall appears diffusely thickened measuring up to 7 mm, and there is slight haziness in the surrounding fat, suggesting inflammation. No definite calcified gallstones are identified. There  does appear to be some intermediate attenuation material layering dependently in the gallbladder, which could indicate the presence of biliary sludge. The appearance of the liver is normal. No intra or extrahepatic biliary ductal dilatation.  Pancreas: Mild fatty atrophy, but otherwise normal in appearance.  Spleen: Unremarkable.  Adrenals/Urinary Tract: Normal appearance of the adrenal glands bilaterally. The kidneys are also normal in appearance bilaterally. Mild bilateral perinephric stranding (nonspecific). No hydroureteronephrosis. Urinary bladder is normal in appearance.  Stomach/Bowel: The appearance of the stomach is normal. No pathologic dilatation of the small bowel or colon. Numerous colonic diverticulae are noted, most pronounced in the distal descending colon and sigmoid colon, without surrounding inflammatory changes to suggest an acute diverticulitis at this time. Normal appendix. Duodenal diverticulum from the second portion of the duodenum in the region of the pancreatic head incidentally noted.  Vascular/Lymphatic: Extensive atherosclerosis throughout the abdominal and pelvic vasculature, without evidence of aneurysm or dissection. No pathologically enlarged lymph nodes are identified in the abdomen or pelvis. Numerous reactive size retroperitoneal lymph nodes are incidentally noted, but nonspecific  Reproductive: Prostate gland is unremarkable in appearance.  Other: No significant volume of ascites.  No pneumoperitoneum.  Musculoskeletal: Multilevel degenerative disc disease, most severe at L2-L3 and L5-S1. Bilateral pars defects at L5 with 9 mm of anterolisthesis of L5 upon S1. There are no aggressive appearing lytic or blastic lesions noted in the visualized portions of the skeleton.  IMPRESSION: 1. Findings, as above, concerning for acute cholecystitis, likely related to the presence of biliary sludge in the gallbladder. Surgical consultation is recommended. 2. Colonic diverticulosis without  findings to suggest acute diverticulitis at this time. 3. Normal appendix. 4. Extensive atherosclerosis, including at least 3 vessel coronary artery disease. 5. Small hiatal hernia. 6. Additional incidental findings, as above. These results were called by telephone at the time of interpretation on 02/11/2014 at 5:56 pm to Dr. Shirlyn Goltz, who verbally acknowledged these results.   Electronically Signed   By: Vinnie Langton M.D.   On: 02/11/2014 17:57    Mr 3d Recon At Scanner  02/13/2014   CLINICAL DATA:  Acute cholecystitis.  Biliary sludge.  EXAM: MRI ABDOMEN WITHOUT CONTRAST  (INCLUDING MRCP)  TECHNIQUE: Multiplanar multisequence MR imaging of the abdomen was performed. Heavily T2-weighted images of the biliary and pancreatic ducts were obtained, and three-dimensional MRCP images were rendered by post processing.  COMPARISON:  02/11/2014  FINDINGS: As is often the case in the inpatient setting, despite efforts by the technologist and patient, motion artifact is present on today's exam and could not be eliminated. This obscures the biliary tree and reduces exam sensitivity and specificity.  Hepatobiliary: Periportal edema. Narrowed CBD segment in the vicinity of the cystic duct attachment. No upstream biliary dilatation. Dependent low T2 signal in the gallbladder favoring sludge. Gallbladder wall thickening at 7 millimeters. Low-grade mesenteric edema in the porta hepatis with small and likely reactive lymph nodes in the porta hepatis. No compelling findings for a filling defect in the common bile duct, although sensitivity for small filling defects is reduced due to the motion artifact. There is a periampullary duodenal diverticulum which does not appear inflamed.  The caudate to right lobe ratio is 0.66, suggesting early cirrhosis. No nodularity of the margins of  the liver noted.  There is low grade edema tracking around the descending duodenum and gallbladder.  Pancreas: Unremarkable  Spleen: Unremarkable   Adrenals/Urinary Tract: Unremarkable where visualized.  Stomach/Bowel: Aside from the previously mentioned periampullary duodenal diverticulum and edema surrounding the descending duodenum, unremarkable.  Vascular/Lymphatic: Scattered small porta hepatis lymph nodes  Other: No supplemental non-categorized findings.  Musculoskeletal: Unremarkable  IMPRESSION: 1. Gallbladder wall thickening with internal sludge. Cholecystitis is not excluded. There is narrowing of the common hepatic duct/common bile duct in the vicinity of the cystic duct attachment, but not enough to cause upstream dilatation. No definite filling defect in the common bile duct. Sensitivity reduced due to motion artifact. 2. Caudate to right lobe ratio in the liver is 0.66, favoring early serve row cysts. No overt hepatic nodularity is present. 3. Periportal edema. 4. Periampullary duodenal diverticulum. 5. There is edema signal surrounding the descending duodenum and the adjacent gallbladder. This could be from gallbladder inflammation or inflammation involving the duodenum. If clinical indicators did not support a gallbladder source, then the possibility of duodenal ulcer or duodenitis might be considered.   Electronically Signed   By: Sherryl Barters M.D.   On: 02/13/2014 08:27   Dg Chest Port 1 View  02/14/2014   CLINICAL DATA:  Initial encounter for Cough and fever beginning last night. Recent cholecystectomy.  EXAM: PORTABLE CHEST - 1 VIEW  COMPARISON:  07/10/2006  FINDINGS: 0746 hrs. Low volume film with vascular congestion and interstitial pulmonary edema. There is subsegmental atelectasis at the right base. The cardio pericardial silhouette is enlarged. Imaged bony structures of the thorax are intact.  IMPRESSION: Cardiomegaly with interstitial pulmonary edema.   Electronically Signed   By: Misty Stanley M.D.   On: 02/14/2014 07:57    Micro Results      Recent Results (from the past 240 hour(s))  SURGICAL PCR SCREEN     Status:  None   Collection Time    02/13/14 12:32 PM      Result Value Ref Range Status   MRSA, PCR NEGATIVE  NEGATIVE Final   Staphylococcus aureus NEGATIVE  NEGATIVE Final   Comment:            The Xpert SA Assay (FDA     approved for NASAL specimens     in patients over 92 years of age),     is one component of     a comprehensive surveillance     program.  Test performance has     been validated by [employer] for patients greater     than or equal to 55 year old.     It is not intended     to diagnose infection nor to     guide or monitor treatment.  URINE CULTURE     Status: None   Collection Time    02/14/14  2:19 PM      Result Value Ref Range Status   Specimen Description URINE, CLEAN CATCH   Final   Special Requests NONE   Final   Culture  Setup Time     Final   Value: 02/14/2014 20:39     Performed at [facility] Count     Final   Value: NO GROWTH     Performed at [facility]   Culture     Final   Value: NO GROWTH     Performed at [facility]   Report Status  02/15/2014 FINAL   Final       Today   Subjective:   Arthur Mcdaniel today has no headache,no chest abdominal pain,no new weakness tingling or numbness, feels much better wants to go home today.   Objective:   Blood pressure 124/64, pulse 71, temperature 97.9 F (36.6 C), temperature source Oral, resp. rate 17, height 5\' 11"  (1.803 m), weight 87.907 kg (193 lb 12.8 oz), SpO2 96.00%.   Intake/Output Summary (Last 24 hours) at 02/16/14 0919 Last data filed at 02/16/14 0601  Gross per 24 hour  Intake     50 ml  Output     20 ml  Net     30 ml    Exam Awake Alert, Oriented x 3, No new F.N deficits, Normal affect [place].AT,PERRAL Supple Neck,No JVD, No cervical lymphadenopathy appriciated.  Symmetrical Chest wall movement, Good air movement bilaterally, CTAB RRR,No Gallops,Rubs or new Murmurs, No Parasternal Heave +ve B.Sounds, Abd Soft, Non tender, right upper  quadrant JP drain in place, No organomegaly appriciated, No rebound -guarding or rigidity. No Cyanosis, Clubbing or edema, No new Rash or bruise  Data Review   CBC w Diff: Lab Results  Component Value Date   WBC 8.3 02/16/2014   HGB 10.5* 02/16/2014   HCT 30.2* 02/16/2014   PLT 181 02/16/2014   LYMPHOPCT 19 02/16/2014   MONOPCT 9 02/16/2014   EOSPCT 3 02/16/2014   BASOPCT 1 02/16/2014    CMP: Lab Results  Component Value Date   NA 137 02/16/2014   K 3.4* 02/16/2014   CL 104 02/16/2014   CO2 19 02/16/2014   BUN 15 02/16/2014   CREATININE 1.13 02/16/2014   PROT 5.3* 02/16/2014   ALBUMIN 2.1* 02/16/2014   BILITOT 1.2 02/16/2014   ALKPHOS 142* 02/16/2014   AST 40* 02/16/2014   ALT 32 02/16/2014  .   Total Time in preparing paper work, data evaluation and todays exam - 35 minutes  Thurnell Lose M.D on 02/16/2014 at 9:19 AM  [facility] Office  [phone]

## 2014-02-16 NOTE — Progress Notes (Signed)
Patient discharged to home with instructions. 

## 2014-02-16 NOTE — Discharge Instructions (Signed)
Follow with Primary MD Horatio Pel, MD in 3 days   Get CBC, CMP, 2 view Chest X ray checked  by Primary MD next visit.    Activity: As tolerated with Full fall precautions use walker/cane & assistance as needed   Disposition Home     Diet: Heart Healthy Low Carb  For Heart failure patients - Check your Weight same time everyday, if you gain over 2 pounds, or you develop in leg swelling, experience more shortness of breath or chest pain, call your Primary MD immediately. Follow Cardiac Low Salt Diet and 1.8 lit/day fluid restriction.   On your next visit with her primary care physician please Get Medicines reviewed and adjusted.  Please request your Prim.MD to go over all Hospital Tests and Procedure/Radiological results at the follow up, please get all Hospital records sent to your Prim MD by signing hospital release before you go home.   If you experience worsening of your admission symptoms, develop shortness of breath, life threatening emergency, suicidal or homicidal thoughts you must seek medical attention immediately by calling 911 or calling your MD immediately  if symptoms less severe.  You Must read complete instructions/literature along with all the possible adverse reactions/side effects for all the Medicines you take and that have been prescribed to you. Take any new Medicines after you have completely understood and accpet all the possible adverse reactions/side effects.   Do not drive, operating heavy machinery, perform activities at heights, swimming or participation in water activities or provide baby sitting services if your were admitted for syncope or siezures until you have seen by Primary MD or a Neurologist and advised to do so again.  Do not drive when taking Pain medications.    Do not take more than prescribed Pain, Sleep and Anxiety Medications  Special Instructions: If you have smoked or chewed Tobacco  in the last 2 yrs please stop smoking, stop  any regular Alcohol  and or any Recreational drug use.  Wear Seat belts while driving.   Please note  You were cared for by a hospitalist during your hospital stay. If you have any questions about your discharge medications or the care you received while you were in the hospital after you are discharged, you can call the unit and asked to speak with the hospitalist on call if the hospitalist that took care of you is not available. Once you are discharged, your primary care physician will handle any further medical issues. Please note that NO REFILLS for any discharge medications will be authorized once you are discharged, as it is imperative that you return to your primary care physician (or establish a relationship with a primary care physician if you do not have one) for your aftercare needs so that they can reassess your need for medications and monitor your lab values.

## 2014-02-19 NOTE — Progress Notes (Signed)
Seen and agree  

## 2014-03-04 ENCOUNTER — Ambulatory Visit: Payer: Medicare Other | Admitting: Cardiology

## 2014-03-07 ENCOUNTER — Ambulatory Visit: Payer: Medicare Other | Admitting: Cardiology

## 2014-03-26 ENCOUNTER — Ambulatory Visit: Payer: Medicare Other | Admitting: Cardiology

## 2014-04-25 ENCOUNTER — Ambulatory Visit: Payer: Medicare Other | Admitting: Neurology

## 2014-05-05 ENCOUNTER — Other Ambulatory Visit: Payer: Self-pay | Admitting: Cardiology

## 2014-05-13 ENCOUNTER — Other Ambulatory Visit: Payer: Self-pay

## 2014-05-13 MED ORDER — AMLODIPINE BESYLATE 2.5 MG PO TABS: ORAL_TABLET | ORAL | Status: DC

## 2014-05-23 ENCOUNTER — Encounter: Payer: Self-pay | Admitting: Cardiology

## 2014-05-23 ENCOUNTER — Ambulatory Visit (INDEPENDENT_AMBULATORY_CARE_PROVIDER_SITE_OTHER): Payer: Medicare Other | Admitting: Cardiology

## 2014-05-23 VITALS — BP 120/72 | HR 74 | Ht 71.0 in | Wt 192.9 lb

## 2014-05-23 DIAGNOSIS — I480 Paroxysmal atrial fibrillation: Secondary | ICD-10-CM

## 2014-05-23 DIAGNOSIS — E785 Hyperlipidemia, unspecified: Secondary | ICD-10-CM

## 2014-05-23 DIAGNOSIS — I25810 Atherosclerosis of coronary artery bypass graft(s) without angina pectoris: Secondary | ICD-10-CM

## 2014-05-23 DIAGNOSIS — I10 Essential (primary) hypertension: Secondary | ICD-10-CM

## 2014-05-23 NOTE — Patient Instructions (Signed)
Continue your current therapy  I will see you in one year   

## 2014-05-23 NOTE — Progress Notes (Signed)
Arthur Mcdaniel Date of Birth: 1930-09-22 Medical Record #[id number]  History of Present Illness: Arthur Mcdaniel is seen today for follow up CAD. He has a history of coronary disease and underwent stenting of the left circumflex coronary in 1997. He also has a prior history of atrial fibrillation during a bout of pleurisy in 2008 but has been in sinus rhythm since then. He has a history of hypercholesterolemia, mild glucose intolerance, and hypertension. His last nuclear stress test in June of 2012 was normal with an ejection fraction of 65%. He continues to do very well. He did have gallbladder surgery in October and did well.   [facility]. He has no limitation. He denies any chest pain or shortness of breath.  Current Outpatient Prescriptions on File Prior to Visit  Medication Sig Dispense Refill  . allopurinol (ZYLOPRIM) 300 MG tablet Take 300 mg by mouth daily.      Marland Kitchen amLODipine (NORVASC) 2.5 MG tablet TAKE 1 BY MOUTH DAILY 30 tablet 0  . aspirin 81 MG tablet Take 81 mg by mouth daily.      . cycloSPORINE (RESTASIS) 0.05 % ophthalmic emulsion Place 1 drop into both eyes 2 (two) times daily.    Marland Kitchen esomeprazole (NEXIUM) 40 MG capsule Take 40 mg by mouth as needed.      . fluticasone (FLONASE) 50 MCG/ACT nasal spray Place 2 sprays into the nose as needed.      . Levothyroxine Sodium 125 MCG CAPS Take by mouth daily before breakfast.    . metFORMIN (GLUCOPHAGE) 850 MG tablet Take 850 mg by mouth 2 (two) times daily with a meal.    . nitroGLYCERIN (NITROSTAT) 0.4 MG SL tablet Place 1 tablet (0.4 mg total) under the tongue every 5 (five) minutes as needed. 25 tablet 11  . simvastatin (ZOCOR) 20 MG tablet Take 20 mg by mouth at bedtime.      . vardenafil (LEVITRA) 20 MG tablet Take 20 mg by mouth daily as needed.      . vitamin B-12 (CYANOCOBALAMIN) 1000 MCG tablet Take 1,000 mcg by mouth daily.       No current facility-administered medications on file prior to visit.     Allergies  Allergen Reactions  . Naproxen Other (See Comments)    Doesn't remember     Past Medical History  Diagnosis Date  . PAF (paroxysmal atrial fibrillation)   . Ischemic heart disease     Remote stent to LCX in 1997  . Gout   . Hypothyroidism   . Hyperlipidemia     Myalgias with Lipitor  . GERD (gastroesophageal reflux disease)   . Melanoma   . Memory deficit   . Detached retina   . Obese   . Normal nuclear stress test June 2012    Past Surgical History  Procedure Laterality Date  . Coronary stent placement  1997    LCX  . Cholecystectomy N/A 02/13/2014    Procedure: LAPAROSCOPIC CHOLECYSTECTOMY ;  Surgeon: Autumn Messing III, MD;  Location: [facility];  Service: General;  Laterality: N/A;    History  Smoking status  . Never Smoker   Smokeless tobacco  . Never Used    History  Alcohol Use No    Family History  Problem Relation Age of Onset  . Heart disease Sister     Review of Systems: As noted in history of present illness.  All other systems were reviewed and are negative.  Physical  Exam: BP 120/72 mmHg  Pulse 74  Ht 5\' 11"  (1.803 m)  Wt 192 lb 14.4 oz (87.499 kg)  BMI 26.92 kg/m2 He is a pleasant white male in no acute distress. HEENT exam is unremarkable. Sclera are clear. Pupils are equal round and reactive. Neck is without JVD, adenopathy, thyromegaly, or bruits. Lungs are clear. Cardiac exam reveals a regular rate and rhythm without gallop, murmur, or click. Abdomen is obese, soft, nontender. There are no masses. Extremities are without edema. Pulses are 2+. He is alert and oriented x3. Cranial nerves II through XII are intact. Skin is warm and dry.  LABORATORY DATA: ECG today demonstrates normal sinus rhythm with a normal Ecg. I have personally reviewed and interpreted this study.   Assessment / Plan: 1. CAD status post stenting of the left circumflex coronary in 1997. Normal nuclear stress test June of 2012. He is asymptomatic. Continue  aspirin and statin therapy. Beta blocker previously discontinued for chronotropic incompetence. I will follow up in one year.  2. Remote history of paroxysmal atrial fibrillation. No recurrence.  3. Hyperlipidemia, on Zocor. Labs followed by primary care.  4. Hypertension, controlled.

## 2014-06-12 ENCOUNTER — Other Ambulatory Visit: Payer: Self-pay | Admitting: Cardiology

## 2014-11-17 ENCOUNTER — Other Ambulatory Visit: Payer: Self-pay | Admitting: Cardiology

## 2014-11-24 ENCOUNTER — Other Ambulatory Visit: Payer: Self-pay | Admitting: Internal Medicine

## 2014-11-24 DIAGNOSIS — R413 Other amnesia: Secondary | ICD-10-CM

## 2014-12-08 ENCOUNTER — Ambulatory Visit
Admission: RE | Admit: 2014-12-08 | Discharge: 2014-12-08 | Disposition: A | Discharge status: Home / Self Care | Payer: Medicare Other | Source: Ambulatory Visit | Attending: Internal Medicine | Admitting: Internal Medicine

## 2014-12-08 DIAGNOSIS — R413 Other amnesia: Secondary | ICD-10-CM

## 2015-01-14 ENCOUNTER — Ambulatory Visit: Payer: Self-pay | Admitting: Neurology

## 2015-01-15 ENCOUNTER — Ambulatory Visit (INDEPENDENT_AMBULATORY_CARE_PROVIDER_SITE_OTHER): Payer: Medicare Other | Admitting: Neurology

## 2015-01-15 ENCOUNTER — Encounter: Payer: Self-pay | Admitting: Neurology

## 2015-01-15 VITALS — BP 112/80 | HR 66 | Resp 16 | Wt 195.0 lb

## 2015-01-15 DIAGNOSIS — E0842 Diabetes mellitus due to underlying condition with diabetic polyneuropathy: Secondary | ICD-10-CM

## 2015-01-15 DIAGNOSIS — E785 Hyperlipidemia, unspecified: Secondary | ICD-10-CM

## 2015-01-15 DIAGNOSIS — G3184 Mild cognitive impairment, so stated: Secondary | ICD-10-CM | POA: Diagnosis not present

## 2015-01-15 DIAGNOSIS — E119 Type 2 diabetes mellitus without complications: Secondary | ICD-10-CM | POA: Diagnosis not present

## 2015-01-15 DIAGNOSIS — I10 Essential (primary) hypertension: Secondary | ICD-10-CM | POA: Diagnosis not present

## 2015-01-15 NOTE — Progress Notes (Signed)
NEUROLOGY CONSULTATION NOTE  Arthur Mcdaniel MRN: [id number] DOB: 08/06/1930  Referring provider: Dr. Deland Pretty Primary care provider: Dr. Deland Pretty  Reason for consult:  Memory loss  Dear Dr Shelia Media:  Thank you for your kind referral of Arthur Mcdaniel for consultation of the above symptoms. Although his history is well known to you, please allow me to reiterate it for the purpose of our medical record. The patient was accompanied to the clinic by his wife who also provides collateral information. Records and images were personally reviewed where available.  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 He had previously been evaluated by neurologist Dr. Rexene Alberts in August 2015, at that time they reported worsening memory since 2014. His MMSE at that time was 25/30. MRI brain and neurocognitive testing was recommended. He reports going for the MRI but could not proceed due to claustrophobia. His wife reports that her daughter had noticed the memory changes at that time, but she herself noticed changes more in the past couple of months. His long-term memory is excellent, but he cannot recall things from 2 hours ago. he denies any significant word-finding difficulties, if he can't find the word, he is able to substitute pretty quickly. He would ask the same questions repeatedly, or ask questions about their destination after they talked about where to go. He reports he has always been like this, that he is an Chief Financial Officer and has a habit of asking things repeatedly, looking at a map 5 times to see where he is going. His wife denies any concerns about his driving, they continue to drive to [place] without difficulties. He did get turned around yesterday getting to his appointment here,  and had to reschedule to today, but states this was due to thinking the building was on the other side of the street. He denies any missed bill payments except for one or two in the past year, but states he does find himself rechecking his ledger more often now to see if all the bills have been paid. He denies any missed medications. No difficulties multitasking. No personality changes such as paranoia, but he does get more aggravated at certain things. No hallucinations. He continues to be active, mowing the lawn and working out at [employer]. 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 He was started on Aricept 10mg  daily around 4 weeks ago, with no side effects.   He has occasional numbness in his toes at night. His wife is concerned he gets tired easily, taking naps in the day. No significant snoring, he denies feeling tired on awakening. Otherwise, he denies any headaches, dizziness, diplopia, dysarthria, dysphagia, neck/back pain, focal numbness/tingling/weakness, bowel/bladder dysfunction. No anosmia, tremors, no falls. He denies any significant head injuries. His mother had memory changes in her 56s.   Laboratory Data: 04/2014: Vitamin B12 764 11/2014: TSH 0.736  PAST MEDICAL HISTORY: Past Medical History  Diagnosis Date  . PAF (paroxysmal atrial fibrillation)   . Ischemic heart disease     Remote stent to LCX in 1997  . Gout   . Hypothyroidism   . Hyperlipidemia     Myalgias with Lipitor  . GERD (gastroesophageal reflux disease)   . Melanoma   . Memory deficit   . Detached retina   .  Obese   . Normal nuclear stress test June 2012    PAST SURGICAL HISTORY: Past Surgical History  Procedure Laterality Date  . Coronary stent placement  1997    LCX  . Cholecystectomy N/A 02/13/2014    Procedure: LAPAROSCOPIC CHOLECYSTECTOMY ;  Surgeon: Autumn Messing III, MD;  Location: [facility];  Service: General;  Laterality: N/A;     MEDICATIONS: Current Outpatient Prescriptions on File Prior to Visit  Medication Sig Dispense Refill  . allopurinol (ZYLOPRIM) 300 MG tablet Take 300 mg by mouth daily.      Marland Kitchen amLODipine (NORVASC) 2.5 MG tablet TAKE 1 BY MOUTH DAILY 90 tablet 1  . aspirin 81 MG tablet Take 81 mg by mouth daily.      . cycloSPORINE (RESTASIS) 0.05 % ophthalmic emulsion Place 1 drop into both eyes 2 (two) times daily.    . fluticasone (FLONASE) 50 MCG/ACT nasal spray Place 2 sprays into the nose as needed.      . Levothyroxine Sodium 125 MCG CAPS Take by mouth daily before breakfast.    . metFORMIN (GLUCOPHAGE) 850 MG tablet Take 850 mg by mouth 2 (two) times daily with a meal.    . simvastatin (ZOCOR) 20 MG tablet Take 20 mg by mouth at bedtime.      . vardenafil (LEVITRA) 20 MG tablet Take 20 mg by mouth daily as needed.      . vitamin B-12 (CYANOCOBALAMIN) 1000 MCG tablet Take 1,000 mcg by mouth daily.      . nitroGLYCERIN (NITROSTAT) 0.4 MG SL tablet Place 1 tablet (0.4 mg total) under the tongue every 5 (five) minutes as needed. (Patient not taking: Reported on 01/15/2015) 25 tablet 11   No current facility-administered medications on file prior to visit.    ALLERGIES: Allergies  Allergen Reactions  . Naproxen Other (See Comments)    Doesn't remember     FAMILY HISTORY: Family History  Problem Relation Age of Onset  . Heart disease Sister     SOCIAL HISTORY: Social History   Social History  . Marital Status: Married    Spouse Name: [name]  . Number of Children: 2  . Years of Education: 18   Occupational History  . Retired    Social History Main Topics  . Smoking status: Never Smoker   . Smokeless tobacco: Never Used  . Alcohol Use: No  . Drug Use: No  . Sexual Activity: Not on file   Other Topics Concern  . Not on file   Social History Narrative   Patient is right handed and resides with wife in a home    REVIEW OF SYSTEMS: Constitutional: No fevers, chills, or  sweats, + generalized fatigue, no change in appetite Eyes: No visual changes, double vision, eye pain Ear, nose and throat: No hearing loss, ear pain, nasal congestion, sore throat Cardiovascular: No chest pain, palpitations Respiratory:  No shortness of breath at rest or with exertion, wheezes GastrointestinaI: No nausea, vomiting, diarrhea, abdominal pain, fecal incontinence Genitourinary:  No dysuria, urinary retention or frequency Musculoskeletal:  No neck pain, back pain Integumentary: No rash, pruritus, skin lesions Neurological: as above Psychiatric: No depression, insomnia, anxiety Endocrine: No palpitations, fatigue, diaphoresis, mood swings, change in appetite, change in weight, increased thirst Hematologic/Lymphatic:  No anemia, purpura, petechiae. Allergic/Immunologic: no itchy/runny eyes, nasal congestion, recent allergic reactions, rashes  PHYSICAL EXAM: Filed Vitals:   01/15/15 1012  BP: 112/80  Pulse: 66  Resp: 16   General: No acute distress Head:  Normocephalic/atraumatic Eyes: Fundoscopic exam shows bilateral sharp discs, no vessel changes, exudates, or hemorrhages Neck: supple, no paraspinal tenderness, full range of motion Back: No paraspinal tenderness Heart: regular rate and rhythm Lungs: Clear to auscultation bilaterally. Vascular: No carotid bruits. Skin/Extremities: No rash, no edema Neurological Exam: Mental status: alert and oriented to person, place, and time, no dysarthria or aphasia, Fund of knowledge is appropriate.  Remote memory intact.  Attention and concentration are normal.    Able to name objects and repeat phrases. Clock drawing 5/5 MMSE - Mini Mental State Exam 01/15/2015  Orientation to time 4  Orientation to Place 5  Registration 3  Attention/ Calculation 5  Recall 0  Language- name 2 objects 2  Language- repeat 1  Language- follow 3 step command 3  Language- read & follow direction 1  Write a sentence 1  Copy design 1  Total score  26   Cranial nerves: CN I: not tested CN II: pupils equal, round and reactive to light, visual fields intact, fundi unremarkable. CN III, IV, VI:  full range of motion, no nystagmus, no ptosis CN V: reports decreased sensation to cold and pin on right V1-3 CN VII: upper and lower face symmetric CN VIII: hearing intact to finger rub CN IX, X: gag intact, uvula midline CN XI: sternocleidomastoid and trapezius muscles intact CN XII: tongue midline Bulk & Tone: normal, no cogwheeling, no fasciculations. Motor: 5/5 throughout with no pronator drift. Sensation: decreased cold and pin on right UE and LE, decreased pin, cold and vibration in stocking distribution to ankles bilaterally, Romberg test negative Deep Tendon Reflexes: +1 throughout except for absent ankle jerks bilaterally, no ankle clonus Plantar responses: downgoing bilaterally Cerebellar: no incoordination on finger to nose, heel to shin. No dysdiadochokinesia Gait: narrow-based and steady, able to tandem walk adequately. Tremor: none  IMPRESSION: This is a pleasant 81 year old right-handed man with a vascular risk factors including hypertension, hyperlipidemia, diabetes, paroxysmal atrial fibrillation, presenting with worsening short-term memory loss. Neurological exam shows decreased sensation on the right side and length-dependent neuropathy. MMSE today 26/30 (previously 25/30 in August 2015), indicating mild cognitive impairment (MCI). We discussed that MCI means there are cognitive problems by report and testing but the patient is functioning normally. Around 50% of MCI patients progress to dementia (functional impairment) over 5 years. Dementia implies that ADLs are compromised. We discussed different causes of memory loss. TSH and B12 normal. MRI brain without contrast will be ordered to assess for underlying structural abnormality and assess vascular load. He was recently started on Aricept, continue 10mg  daily. We discussed the  importance of control of vascular risk factors, physical exercise, and brain stimulation exercises for brain health. He will follow-up in 6 months and knows to call our office for any changes.   Thank you for allowing me to participate in the care of this patient. Please do not hesitate to call for any questions or concerns.   Ellouise Newer, M.D.  CC: Dr. Shelia Media

## 2015-01-15 NOTE — Patient Instructions (Addendum)
1. Schedule open MRI brain without contrast 2. Continue Donepezil 10mg  daily 3. Control of BP, cholesterol, diabetes, as well as physical exercise and brain stimulation exercises are important for brain health 4. Follow-up in 6 months, call for any changes  5. YOU HAVE BEEN SCHEDULED AT [facility] FOR BRAIN MRI ON 01/29/2015. PLEASE ARRIVE @ 10:00 AM.    [place], [place] [address]  [phone]

## 2015-01-21 ENCOUNTER — Telehealth: Payer: Self-pay | Admitting: Neurology

## 2015-01-21 NOTE — Telephone Encounter (Signed)
Pt/wife/Arthur Mcdaniel/ called for a copy of consultation notes/ concerning his short term memory/ call back @ [phone]

## 2015-01-21 NOTE — Telephone Encounter (Signed)
I spoke with patient's wife/Shirley she is requesting a copy of patient's recent office visit. I mailed her a copy of the note.

## 2015-07-15 ENCOUNTER — Ambulatory Visit: Payer: Medicare Other | Admitting: Neurology

## 2015-07-17 ENCOUNTER — Other Ambulatory Visit: Payer: Self-pay | Admitting: Cardiology

## 2015-07-17 MED ORDER — AMLODIPINE BESYLATE 2.5 MG PO TABS: 2.5000 mg | ORAL_TABLET | Freq: Every day | ORAL | Status: DC

## 2015-07-17 NOTE — Telephone Encounter (Signed)
Rx(s) sent to pharmacy electronically.  

## 2015-07-29 ENCOUNTER — Encounter: Payer: Self-pay | Admitting: Neurology

## 2015-07-29 ENCOUNTER — Ambulatory Visit (INDEPENDENT_AMBULATORY_CARE_PROVIDER_SITE_OTHER): Payer: Medicare Other | Admitting: Neurology

## 2015-07-29 VITALS — BP 130/74 | HR 62 | Resp 16 | Wt 201.0 lb

## 2015-07-29 DIAGNOSIS — F0390 Unspecified dementia without behavioral disturbance: Secondary | ICD-10-CM

## 2015-07-29 DIAGNOSIS — F03A0 Unspecified dementia, mild, without behavioral disturbance, psychotic disturbance, mood disturbance, and anxiety: Secondary | ICD-10-CM

## 2015-07-29 NOTE — Patient Instructions (Signed)
1. Continue Donepezil 10mg  daily 2. Physical exercise and brain stimulation exercises are important for brain health. Look into the [place] diet 3. Follow-up in 6 months

## 2015-07-29 NOTE — Progress Notes (Signed)
NEUROLOGY FOLLOW UP OFFICE NOTE  HEKTOR ANTHES [id number]  HISTORY OF PRESENT ILLNESS: I had the pleasure of seeing Arthur Mcdaniel in follow-up in the neurology clinic on 07/29/2015. He is again accompanied by his wife who helps supplement the history today. The patient was last seen 6 months ago for worsening memory. MMSE in September 2016 was 26/30. He had been taking Aricept 10mg  daily without any side effects. Since his last visit, he feels his memory is unchanged. His wife feels it is okay, but states it has not improved. He continues to drive without getting lost. He denies any missed bills or medications. He denies any word-finding difficulties but cannot remember names of people in his church. His wife continues to notice repeated questions. He denies any headaches, dizziness, diplopia, dysarthria, dysphagia, neck/back pain, focal numbness/tingling/weakness, bowel/bladder dysfunction. No falls.   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 He had previously been evaluated by neurologist Dr. Rexene Alberts in August 2015. His MMSE at that time was 25/30. MRI brain and neurocognitive testing was recommended. He reports going for the MRI but could not proceed due to claustrophobia. His wife reports that her daughter had noticed the memory changes at that time, but she herself noticed changes more in the past couple of months. His long-term memory is excellent, but he cannot recall things from 2 hours ago. He denies any significant word-finding difficulties, if he can't find the word, he is able to substitute pretty quickly. He would ask the same questions repeatedly, or ask questions about their destination after they talked about where to go. He reports he has always been like this, that he is an Chief Financial Officer and has a habit of asking things  repeatedly, looking at a map 5 times to see where he is going. His wife denies any concerns about his driving, they continue to drive to [place] without difficulties. He did get turned around yesterday getting to his appointment here, and had to reschedule to today, but states this was due to thinking the building was on the other side of the street. He denies any missed bill payments except for one or two in the past year, but states he does find himself rechecking his ledger more often now to see if all the bills have been paid. He denies any missed medications. No difficulties multitasking. No personality changes such as paranoia, but he does get more aggravated at certain things. No hallucinations. He continues to be active, mowing the lawn and working out at [employer]. 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 He was started on Aricept 10mg  daily around 4 weeks ago, with no side effects.  He denies any significant head injuries. His mother had memory changes in her 80s.   Laboratory Data: 04/2014: Vitamin B12 764 11/2014: TSH 0.736  PAST MEDICAL HISTORY: Past Medical History  Diagnosis Date  . PAF (paroxysmal atrial fibrillation)   . Ischemic heart disease     Remote stent to LCX in 1997  . Gout   . Hypothyroidism   . Hyperlipidemia     Myalgias with Lipitor  . GERD (gastroesophageal reflux disease)   . Melanoma   . Memory deficit   . Detached retina   . Obese   . Normal nuclear stress test June 2012  MEDICATIONS: Current Outpatient Prescriptions on File Prior to Visit  Medication Sig Dispense Refill  . allopurinol (ZYLOPRIM) 300 MG tablet Take 300 mg by mouth daily.      Marland Kitchen aspirin 81 MG tablet Take 81 mg by mouth daily.      . cycloSPORINE (RESTASIS) 0.05 % ophthalmic emulsion Place 1 drop into both eyes 2 (two) times daily.    Marland Kitchen donepezil (ARICEPT) 10 MG tablet Take 10 mg by mouth at bedtime. Take 1 tablet at bedtime   0  . fluticasone (FLONASE) 50 MCG/ACT nasal spray Place 2 sprays into the nose as needed.      . Levothyroxine Sodium 125 MCG CAPS Take by mouth daily before breakfast.    . metFORMIN (GLUCOPHAGE) 850 MG tablet Take 850 mg by mouth 2 (two) times daily with a meal.    . nitroGLYCERIN (NITROSTAT) 0.4 MG SL tablet Place 1 tablet (0.4 mg total) under the tongue every 5 (five) minutes as needed. (Patient not taking: Reported on 01/15/2015) 25 tablet 11  . pyridOXINE (VITAMIN B-6) 100 MG tablet Take 100 mg by mouth daily.    . simvastatin (ZOCOR) 20 MG tablet Take 20 mg by mouth at bedtime.      . vardenafil (LEVITRA) 20 MG tablet Take 20 mg by mouth daily as needed.      . vitamin B-12 (CYANOCOBALAMIN) 1000 MCG tablet Take 1,000 mcg by mouth daily.       No current facility-administered medications on file prior to visit.    ALLERGIES: Allergies  Allergen Reactions  . Naproxen Other (See Comments)    Doesn't remember     FAMILY HISTORY: Family History  Problem Relation Age of Onset  . Heart disease Sister     SOCIAL HISTORY: Social History   Social History  . Marital Status: Married    Spouse Name: [name]  . Number of Children: 2  . Years of Education: 18   Occupational History  . Retired    Social History Main Topics  . Smoking status: Never Smoker   . Smokeless tobacco: Never Used  . Alcohol Use: No  . Drug Use: No  . Sexual Activity: Not on file   Other Topics Concern  . Not on file   Social History Narrative   Patient is right handed and resides with wife in a home    REVIEW OF SYSTEMS: Constitutional: No fevers, chills, or sweats, no generalized fatigue, change in appetite Eyes: No visual changes, double vision, eye pain Ear, nose and throat: No hearing loss, ear pain, nasal congestion, sore throat Cardiovascular: No chest pain, palpitations Respiratory:  No shortness of breath at rest or with exertion, wheezes GastrointestinaI: No nausea, vomiting, diarrhea,  abdominal pain, fecal incontinence Genitourinary:  No dysuria, urinary retention or frequency Musculoskeletal:  No neck pain, back pain Integumentary: No rash, pruritus, skin lesions Neurological: as above Psychiatric: No depression, insomnia, anxiety Endocrine: No palpitations, fatigue, diaphoresis, mood swings, change in appetite, change in weight, increased thirst Hematologic/Lymphatic:  No anemia, purpura, petechiae. Allergic/Immunologic: no itchy/runny eyes, nasal congestion, recent allergic reactions, rashes  PHYSICAL EXAM: Filed Vitals:   07/29/15 1241  BP: 130/74  Pulse: 62   General: No acute distress Head:  Normocephalic/atraumatic Neck: supple, no paraspinal tenderness, full range of motion Heart:  Regular rate and rhythm Lungs:  Clear to auscultation bilaterally Back: No paraspinal tenderness Skin/Extremities: No rash, no edema Neurological Exam: alert and oriented to person, place, month/year. No aphasia or dysarthria. Fund of knowledge  is appropriate.  Remote memory intact.  Attention and concentration are normal.    Able to name objects and repeat phrases. CDT 5/5 MMSE - Mini Mental State Exam 07/29/2015 01/15/2015  Orientation to time 2 4  Orientation to Place 5 5  Registration 3 3  Attention/ Calculation 4 5  Recall 0 0  Language- name 2 objects 2 2  Language- repeat 1 1  Language- follow 3 step command 3 3  Language- read & follow direction 1 1  Write a sentence 1 1  Copy design 1 1  Total score 23 26   Cranial nerves: Pupils equal, round, reactive to light. Extraocular movements intact with no nystagmus. Visual fields full. Facial sensation intact. No facial asymmetry. Tongue, uvula, palate midline.  Motor: Bulk and tone normal, muscle strength 5/5 throughout with no pronator drift.  Sensation to light touch intact.  No extinction to double simultaneous stimulation.  Deep tendon reflexes 2+ throughout, toes downgoing.  Finger to nose testing intact.  Gait  narrow-based and steady, able to tandem walk adequately.  Romberg negative.  IMPRESSION: This is a pleasant 82 yo RH man with a vascular risk factors including hypertension, hyperlipidemia, diabetes, paroxysmal atrial fibrillation, with worsening short-term memory loss. Neurological exam non-focal. MMSE today 23/30 (previously 26/30 in September 2016), indicating mild dementia. At this time, Aricept is still the medication indicated for mild dementia. His wife reported no improvement on Aricept, we discussed expectations from the medication, that these are not curative, but are to help slow down progression of worsening. They expressed understanding. Continue daily Aricept 10mg . He did not do MRI ordered on last visit, continue to monitor clinically for now, if symptoms change significantly, would proceed with MRI brain. We again discussed the importance of control of vascular risk factors, physical exercise, and brain stimulation exercises for brain health. He will follow-up in 6 months and knows to call our office for any changes.   Thank you for allowing me to participate in his care.  Please do not hesitate to call for any questions or concerns.  The duration of this appointment visit was 25 minutes of face-to-face time with the patient.  Greater than 50% of this time was spent in counseling, explanation of diagnosis, planning of further management, and coordination of care.   Ellouise Newer, M.D.   CC: Dr. Shelia Media

## 2015-08-12 IMAGING — CR DG CHEST 1V PORT
1 series · 1 of 1 positions shown · non-contrast
Comparison: 07/10/2006

CLINICAL DATA: Initial encounter for Cough and fever beginning last
night. Recent cholecystectomy.

EXAM:
PORTABLE CHEST - 1 VIEW

[portable]
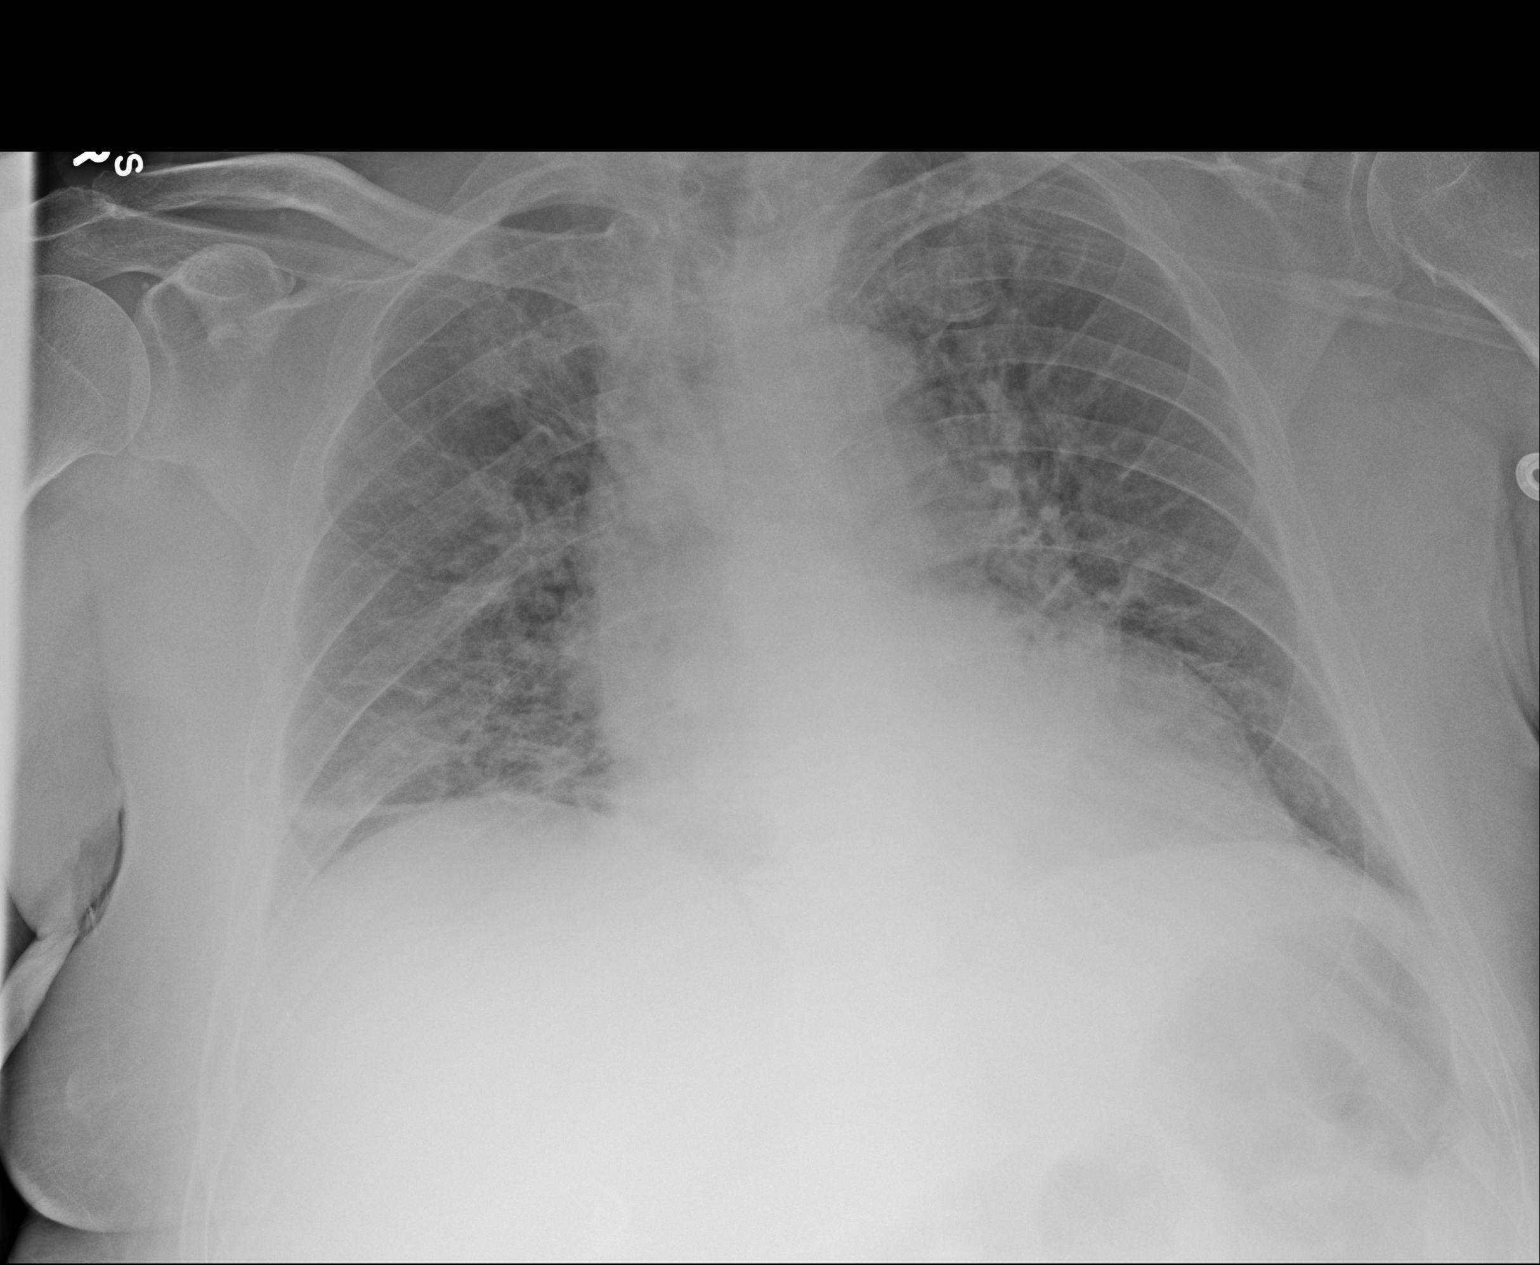

[1 of 1 positions shown; findings below may reference images not displayed]

FINDINGS: 5606 hrs. Low volume film with vascular congestion and interstitial
pulmonary edema. There is subsegmental atelectasis at the right
base. The cardio pericardial silhouette is enlarged. Imaged bony
structures of the thorax are intact.
IMPRESSION: Cardiomegaly with interstitial pulmonary edema.

## 2015-10-08 ENCOUNTER — Telehealth: Payer: Self-pay | Admitting: Cardiology

## 2015-10-08 MED ORDER — AMLODIPINE BESYLATE 2.5 MG PO TABS: 2.5000 mg | ORAL_TABLET | Freq: Every day | ORAL | Status: DC

## 2015-10-08 NOTE — Telephone Encounter (Signed)
New Message  Pt c/o medication issue: 1. Name of Medication:amLODipine (NORVASC) 2.5 MG tablet  4. What is your medication issue? Will the pt need to continue this medication. If so can he retrieve a refills   Appt Sch on 10/27/2015 with Rosaria Ferries at 2P

## 2015-10-08 NOTE — Telephone Encounter (Signed)
Returned call and verified continuation of medication, called in to pharmacy of preference for patient.  Caller voiced thanks and acknowledgment.

## 2015-10-27 ENCOUNTER — Ambulatory Visit (INDEPENDENT_AMBULATORY_CARE_PROVIDER_SITE_OTHER): Payer: Medicare Other | Admitting: Physician Assistant

## 2015-10-27 ENCOUNTER — Encounter: Payer: Self-pay | Admitting: Physician Assistant

## 2015-10-27 VITALS — BP 136/68 | HR 66 | Ht 71.0 in | Wt 200.2 lb

## 2015-10-27 DIAGNOSIS — I480 Paroxysmal atrial fibrillation: Secondary | ICD-10-CM

## 2015-10-27 DIAGNOSIS — I2510 Atherosclerotic heart disease of native coronary artery without angina pectoris: Secondary | ICD-10-CM | POA: Diagnosis not present

## 2015-10-27 NOTE — Progress Notes (Signed)
Cardiology Office Note   Date:  10/27/2015   ID:  Arthur Mcdaniel, DOB 1930-12-21, MRN [id number]  PCP:  Horatio Pel, MD  Cardiologist:  Dr Martinique  Arcelia Pals, PA-C   Chief Complaint  Patient presents with  . Coronary Artery Disease    f/u  . Paroxysmal atrial fib    f/u    History of Present Illness: Arthur Mcdaniel is a 82 y.o. male with a history of HTN, HLD, PAF 2008 not anticoagulated, CFX stent 1997, GERD, memory issues on Aricept  [name] presents for Cardiology followup  In general, [name] does very well. [facility] [place] spend between 30 minutes and an hour there exercising. His ability to exercise and the things he is able to do have not changed recently.   He has some chronic dyspnea on exertion that has not changed. He feels his weight is stable and has not had problems with lower extremity edema, orthopnea or PND.   He checks his blood pressure intermittently and does not remember any extremely high or low numbers. His heart rate is generally well-controlled.  They're trying to eat healthy. He is able to do things like mow the lawn and do some yard work without any difficulty. He never gets chest pain with exertion. He never gets palpitations. He was having some PACs during his office visit, but was not aware of them. He never gets lightheaded or dizzy. He gets tired when he works outside in [employer], but does not feel that this has changed recently.   Past Medical History  Diagnosis Date  . PAF (paroxysmal atrial fibrillation) ([place])   . Ischemic heart disease     Remote stent to LCX in 1997  . Gout   . Hypothyroidism   . Hyperlipidemia     Myalgias with Lipitor  . GERD (gastroesophageal reflux disease)   . Melanoma ([place])   . Memory deficit   . Detached retina   . Obese   . Normal nuclear stress test June 2012    Past Surgical History  Procedure Laterality Date  . Coronary stent placement  1997     LCX  . Cholecystectomy N/A 02/13/2014    Procedure: LAPAROSCOPIC CHOLECYSTECTOMY ;  Surgeon: Autumn Messing III, MD;  Location: [facility];  Service: General;  Laterality: N/A;    Current Outpatient Prescriptions  Medication Sig Dispense Refill  . allopurinol (ZYLOPRIM) 300 MG tablet Take 300 mg by mouth daily.      Arthur Mcdaniel amLODipine (NORVASC) 2.5 MG tablet Take 1 tablet (2.5 mg total) by mouth daily. 30 tablet 5  . aspirin 81 MG tablet Take 81 mg by mouth daily.      . cycloSPORINE (RESTASIS) 0.05 % ophthalmic emulsion Place 1 drop into both eyes 2 (two) times daily.    Arthur Mcdaniel donepezil (ARICEPT) 10 MG tablet Take 10 mg by mouth at bedtime. Take 1 tablet at bedtime  0  . fluticasone (FLONASE) 50 MCG/ACT nasal spray Place 2 sprays into the nose as needed. Reported on 07/29/2015    . levothyroxine (SYNTHROID, LEVOTHROID) 100 MCG tablet 1 tablet. Take 1 tablet daily  0  . metFORMIN (GLUCOPHAGE) 850 MG tablet Take 850 mg by mouth 2 (two) times daily with a meal.    . nitroGLYCERIN (NITROSTAT) 0.4 MG SL tablet Place 1 tablet (0.4 mg total) under the tongue every 5 (five) minutes as needed. 25 tablet 11  . pyridOXINE (  VITAMIN B-6) 100 MG tablet Take 100 mg by mouth daily.    . simvastatin (ZOCOR) 20 MG tablet Take 20 mg by mouth at bedtime.      . vitamin B-12 (CYANOCOBALAMIN) 1000 MCG tablet Take 1,000 mcg by mouth daily.       No current facility-administered medications for this visit.    Allergies:   Naproxen    Social History:  The patient  reports that he has never smoked. He has never used smokeless tobacco. He reports that he does not drink alcohol or use illicit drugs.   Family History:  The patient's family history includes Heart disease in his sister.    ROS:  Please see the history of present illness. All other systems are reviewed and negative.    PHYSICAL EXAM: VS:  BP 136/68 mmHg  Pulse 66  Ht 5\' 11"  (1.803 m)  Wt 200 lb 3.2 oz (90.81 kg)  BMI 27.93 kg/m2 , BMI Body mass index is 27.93  kg/(m^2). GEN: Well nourished, well developed, male in no acute distress HEENT: normal for age  Neck: no JVD, no carotid bruit, no masses Cardiac: RRR; no murmur, no rubs, or gallops Respiratory:  clear to auscultation bilaterally, normal work of breathing GI: soft, nontender, nondistended, + BS MS: no deformity or atrophy; trace left lower extremity edema; distal pulses are 2+ in all 4 extremities  Skin: warm and dry, no rash Neuro:  Strength and sensation are intact Psych: euthymic mood, full affect   EKG:  EKG is ordered today. The ekg ordered today demonstrates sinus rhythm and sinus arrhythmia, heart rate 66, Q waves in leads 3 and aVF. The Q waves in lead aVF are new, otherwise ECG is unchanged   Recent Labs: No results found for requested labs within last 365 days.    Lipid Panel No results found for: CHOL, TRIG, HDL, CHOLHDL, VLDL, LDLCALC, LDLDIRECT   Wt Readings from Last 3 Encounters:  10/27/15 200 lb 3.2 oz (90.81 kg)  07/29/15 201 lb (91.173 kg)  01/15/15 195 lb (88.451 kg)     Other studies Reviewed: Additional studies/ records that were reviewed today include: Office notes and other available records  ASSESSMENT AND PLAN:  1.  CAD: It has been 20 years since the stent to his circumflex. He is compliant with his medications and exercises on a daily basis. He has felt a little tired recently but feels this is because of the heat. In general he states there is no difference in his ability to exert himself. He does not feel that he has any activity limitations at all. He is encouraged to keep his activity level elevated and to contact us if he notices any difference at all. He and his wife were present and I discussed the fact that he had no recent stress test or heart catheterization. If he begins having any ischemic symptoms, aggressive intervention is indicated. For now, because he is not having any symptoms, I will defer further workup.  He is on good medical  therapy with baby aspirin, statin. He has not been on a beta blocker. I will not start one at this time, I will leave this to Dr. Martinique. His lipids are followed by his primary care physician. His blood pressure and heart rate are within normal limits.  2. PAF: He never has palpitations or presyncope. However, he was unaware of the skipping beats was having during his exam. He is encouraged to watch her symptoms and check his blood  pressure or often. He is advised to look on the blood pressure machine as it probably indicates an irregular heart rate. If he sees this or gets other symptoms, he is to contact us. His wife assures me this will happen.  I discussed the PAF is more likely to recur as he gets older. I advised that if he has any couldn't increase his stroke risk. I advised they think he is having or PAF he can wear a monitor, get an EKG. If we determine that he is having more PAF, he will need to be on blood thinners but I will defer this for now.   Current medicines are reviewed at length with the patient today.  The patient does not have concerns regarding medicines.  The following changes have been made:  no change  Labs/ tests ordered today include:   ECG   Disposition:   FU with Dr. Martinique  Signed, Rosaria Ferries, PA-C  10/27/2015 5:05 PM    [facility] Phone: [phone]; Fax: [phone]  This note was written with the assistance of speech recognition software. Please excuse any transcriptional errors.

## 2015-10-27 NOTE — Patient Instructions (Addendum)
Your physician wants you to follow-up in: 12 months with Dr. Martinique. You will receive a reminder letter in the mail two months in advance. If you don't receive a letter, please call our office to schedule the follow-up appointment.

## 2016-02-03 ENCOUNTER — Encounter: Payer: Self-pay | Admitting: Neurology

## 2016-02-03 ENCOUNTER — Ambulatory Visit (INDEPENDENT_AMBULATORY_CARE_PROVIDER_SITE_OTHER): Payer: Medicare Other | Admitting: Neurology

## 2016-02-03 VITALS — BP 130/78 | HR 66 | Ht 71.0 in | Wt 200.1 lb

## 2016-02-03 DIAGNOSIS — E785 Hyperlipidemia, unspecified: Secondary | ICD-10-CM

## 2016-02-03 DIAGNOSIS — I10 Essential (primary) hypertension: Secondary | ICD-10-CM

## 2016-02-03 DIAGNOSIS — G3184 Mild cognitive impairment, so stated: Secondary | ICD-10-CM | POA: Diagnosis not present

## 2016-02-03 DIAGNOSIS — E119 Type 2 diabetes mellitus without complications: Secondary | ICD-10-CM | POA: Diagnosis not present

## 2016-02-03 NOTE — Patient Instructions (Signed)
1. Continue Donepezil 10mg  daily to help slow down progression of memory loss 2. Control of blood pressure, cholesterol, and diabetes, as well as physical exercise and brain stimulation exercises are important for brain health 3. Follow-up in 1 year, call for any changes

## 2016-02-03 NOTE — Progress Notes (Signed)
NEUROLOGY FOLLOW UP OFFICE NOTE  SCHUYLER MCLAWHORN [id number]  HISTORY OF PRESENT ILLNESS: I had the pleasure of seeing Arthur Mcdaniel in follow-up in the neurology clinic on 02/03/2016. He is again accompanied by his wife who helps supplement the history today. The patient was last seen 6 months ago for worsening memory. MMSE in March 2017 was 23/30 (26/30 in September 2016). He had been taking Aricept 10mg  daily without any side effects. Since his last visit, he feels his memory is unchanged, he notices short- and long-term memory changes. His wife feels memory loss has progressed a little more, he asks a couple of times different things. He continues to drive without getting lost. He denies any missed bills or medications. He denies any headaches, dizziness, diplopia, dysarthria, dysphagia, neck/back pain, focal numbness/tingling/weakness, bowel/bladder dysfunction. No falls.   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 He had previously been evaluated by neurologist Dr. Rexene Alberts in August 2015. His MMSE at that time was 25/30. MRI brain and neurocognitive testing was recommended. He reports going for the MRI but could not proceed due to claustrophobia. His wife reports that her daughter had noticed the memory changes at that time, but she herself noticed changes more in the past couple of months. His long-term memory is excellent, but he cannot recall things from 2 hours ago. He denies any significant word-finding difficulties, if he can't find the word, he is able to substitute pretty quickly. He would ask the same questions repeatedly, or ask questions about their destination after they talked about where to go. He reports he has always been like this, that he is an Chief Financial Officer and has a habit of asking things repeatedly, looking at a map 5  times to see where he is going. His wife denies any concerns about his driving, they continue to drive to [place] without difficulties. He did get turned around yesterday getting to his appointment here, and had to reschedule to today, but states this was due to thinking the building was on the other side of the street. He denies any missed bill payments except for one or two in the past year, but states he does find himself rechecking his ledger more often now to see if all the bills have been paid. He denies any missed medications. No difficulties multitasking. No personality changes such as paranoia, but he does get more aggravated at certain things. No hallucinations. He continues to be active, mowing the lawn and working out at [employer]. 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 He was started on Aricept 10mg  daily in 2016, with no side effects. He denies any significant head injuries. His mother had memory changes in her 25s.   Laboratory Data: 04/2014: Vitamin B12 764 11/2014: TSH 0.736  PAST MEDICAL HISTORY: Past Medical History:  Diagnosis Date  . Detached retina   . GERD (gastroesophageal reflux disease)   . Gout   . Hyperlipidemia    Myalgias with Lipitor  . Hypothyroidism   . Ischemic heart disease    Remote stent to LCX in 1997  . Melanoma ([place])   . Memory deficit   . Normal nuclear stress test June 2012  . Obese   . PAF (paroxysmal atrial fibrillation) [facility])     MEDICATIONS: Current Outpatient Prescriptions on File  Prior to Visit  Medication Sig Dispense Refill  . allopurinol (ZYLOPRIM) 300 MG tablet Take 300 mg by mouth daily.      Marland Kitchen amLODipine (NORVASC) 2.5 MG tablet Take 1 tablet (2.5 mg total) by mouth daily. 30 tablet 5  . aspirin 81 MG tablet Take 81 mg by mouth daily.      . cycloSPORINE (RESTASIS) 0.05 % ophthalmic emulsion Place 1 drop into both eyes 2 (two) times daily.    Marland Kitchen donepezil (ARICEPT) 10 MG  tablet Take 10 mg by mouth at bedtime. Take 1 tablet at bedtime  0  . fluticasone (FLONASE) 50 MCG/ACT nasal spray Place 2 sprays into the nose as needed. Reported on 07/29/2015    . levothyroxine (SYNTHROID, LEVOTHROID) 100 MCG tablet 1 tablet. Take 1 tablet daily  0  . metFORMIN (GLUCOPHAGE) 850 MG tablet Take 850 mg by mouth 2 (two) times daily with a meal.    . nitroGLYCERIN (NITROSTAT) 0.4 MG SL tablet Place 1 tablet (0.4 mg total) under the tongue every 5 (five) minutes as needed. 25 tablet 11  . pyridOXINE (VITAMIN B-6) 100 MG tablet Take 100 mg by mouth daily.    . simvastatin (ZOCOR) 20 MG tablet Take 20 mg by mouth at bedtime.      . vitamin B-12 (CYANOCOBALAMIN) 1000 MCG tablet Take 1,000 mcg by mouth daily.       No current facility-administered medications on file prior to visit.     ALLERGIES: Allergies  Allergen Reactions  . Naproxen Other (See Comments)    Doesn't remember     FAMILY HISTORY: Family History  Problem Relation Age of Onset  . Heart disease Sister     SOCIAL HISTORY: Social History   Social History  . Marital status: Married    Spouse name: [name]  . Number of children: 2  . Years of education: 52   Occupational History  . Retired    Social History Main Topics  . Smoking status: Never Smoker  . Smokeless tobacco: Never Used  . Alcohol use No  . Drug use: No  . Sexual activity: Not on file   Other Topics Concern  . Not on file   Social History Narrative   Patient is right handed and resides with wife in a home    REVIEW OF SYSTEMS: Constitutional: No fevers, chills, or sweats, no generalized fatigue, change in appetite Eyes: No visual changes, double vision, eye pain Ear, nose and throat: No hearing loss, ear pain, nasal congestion, sore throat Cardiovascular: No chest pain, palpitations Respiratory:  No shortness of breath at rest or with exertion, wheezes GastrointestinaI: No nausea, vomiting, diarrhea, abdominal pain, fecal  incontinence Genitourinary:  No dysuria, urinary retention or frequency Musculoskeletal:  No neck pain, back pain Integumentary: No rash, pruritus, skin lesions Neurological: as above Psychiatric: No depression, insomnia, anxiety Endocrine: No palpitations, fatigue, diaphoresis, mood swings, change in appetite, change in weight, increased thirst Hematologic/Lymphatic:  No anemia, purpura, petechiae. Allergic/Immunologic: no itchy/runny eyes, nasal congestion, recent allergic reactions, rashes  PHYSICAL EXAM: Vitals:   02/03/16 1302  BP: 130/78  Pulse: 66   General: No acute distress Head:  Normocephalic/atraumatic Neck: supple, no paraspinal tenderness, full range of motion Heart:  Regular rate and rhythm Lungs:  Clear to auscultation bilaterally Back: No paraspinal tenderness Skin/Extremities: No rash, no edema Neurological Exam: alert and oriented to person, place, month/season, initially said year was 2016, then corrected to 2017. No aphasia or dysarthria. Fund of knowledge is appropriate.  Remote memory intact.  Attention and concentration are normal.    Able to name objects and repeat phrases. CDT 5/5 MMSE - Mini Mental State Exam 02/03/2016 07/29/2015 01/15/2015  Orientation to time 2 2 4   Orientation to Place 4 5 5   Registration 3 3 3   Attention/ Calculation 5 4 5   Recall 0 0 0  Language- name 2 objects 2 2 2   Language- repeat 1 1 1   Language- follow 3 step command 3 3 3   Language- read & follow direction 1 1 1   Write a sentence 1 1 1   Copy design 1 1 1   Total score 23 23 26    Cranial nerves: Pupils equal, round, reactive to light. Extraocular movements intact with no nystagmus. Visual fields full. Facial sensation intact. No facial asymmetry. Tongue, uvula, palate midline.  Motor: Bulk and tone normal, muscle strength 5/5 throughout with no pronator drift.  Sensation to light touch intact.  No extinction to double simultaneous stimulation.  Deep tendon reflexes 2+  throughout, toes downgoing.  Finger to nose testing intact.  Gait narrow-based and steady, able to tandem walk adequately.  Romberg negative.  IMPRESSION: This is a pleasant 82 yo RH man with a vascular risk factors including hypertension, hyperlipidemia, diabetes, paroxysmal atrial fibrillation, with worsening short-term memory loss. Neurological exam non-focal. MMSE today again 23/30, (similar to March 2017, previously 26/30 in September 2016), indicating mild dementia, however by history suggestive of mild cognitive impairment. Continue daily Aricept 10mg . They are concerned about his other medications potentially affecting memory, we discussed statins and reports of effects on cognition, but also effects of poorly controlled cholesterol on memory. They would like to discuss this further with his PCP, concerned he is taking too much medications. We again discussed the importance of control of vascular risk factors, physical exercise, and brain stimulation exercises for brain health. He will follow-up in 1 year and knows to call our office for any changes.   Thank you for allowing me to participate in his care.  Please do not hesitate to call for any questions or concerns.  The duration of this appointment visit was 25 minutes of face-to-face time with the patient.  Greater than 50% of this time was spent in counseling, explanation of diagnosis, planning of further management, and coordination of care.   Ellouise Newer, M.D.   CC: Dr. Shelia Media

## 2016-03-28 ENCOUNTER — Other Ambulatory Visit: Payer: Self-pay | Admitting: *Deleted

## 2016-03-28 ENCOUNTER — Other Ambulatory Visit: Payer: Self-pay | Admitting: Cardiology

## 2016-05-15 ENCOUNTER — Emergency Department (HOSPITAL_COMMUNITY): Payer: Medicare Other

## 2016-05-15 ENCOUNTER — Encounter (HOSPITAL_COMMUNITY): Payer: Self-pay | Admitting: Emergency Medicine

## 2016-05-15 ENCOUNTER — Emergency Department (HOSPITAL_COMMUNITY)
Admission: EM | Admit: 2016-05-15 | Discharge: 2016-05-15 | Disposition: A | Discharge status: Home / Self Care | Payer: Medicare Other | Attending: Emergency Medicine | Admitting: Emergency Medicine

## 2016-05-15 DIAGNOSIS — E119 Type 2 diabetes mellitus without complications: Secondary | ICD-10-CM | POA: Insufficient documentation

## 2016-05-15 DIAGNOSIS — I2510 Atherosclerotic heart disease of native coronary artery without angina pectoris: Secondary | ICD-10-CM | POA: Diagnosis not present

## 2016-05-15 DIAGNOSIS — Z79899 Other long term (current) drug therapy: Secondary | ICD-10-CM | POA: Insufficient documentation

## 2016-05-15 DIAGNOSIS — R112 Nausea with vomiting, unspecified: Secondary | ICD-10-CM | POA: Diagnosis not present

## 2016-05-15 DIAGNOSIS — E039 Hypothyroidism, unspecified: Secondary | ICD-10-CM | POA: Insufficient documentation

## 2016-05-15 DIAGNOSIS — Z955 Presence of coronary angioplasty implant and graft: Secondary | ICD-10-CM | POA: Insufficient documentation

## 2016-05-15 DIAGNOSIS — E860 Dehydration: Secondary | ICD-10-CM | POA: Insufficient documentation

## 2016-05-15 DIAGNOSIS — R55 Syncope and collapse: Secondary | ICD-10-CM | POA: Diagnosis present

## 2016-05-15 LAB — BASIC METABOLIC PANEL
Anion gap: 13 (ref 5–15)
BUN: 18 mg/dL (ref 6–20)
CALCIUM: 9.6 mg/dL (ref 8.9–10.3)
CO2: 20 mmol/L — ABNORMAL LOW (ref 22–32)
Chloride: 105 mmol/L (ref 101–111)
Creatinine, Ser: 1.32 mg/dL — ABNORMAL HIGH (ref 0.61–1.24)
GFR calc non Af Amer: 48 mL/min — ABNORMAL LOW (ref >60)
GFR, EST AFRICAN AMERICAN: 56 mL/min — AB (ref >60)
GLUCOSE: 142 mg/dL — AB (ref 65–99)
Potassium: 4.5 mmol/L (ref 3.5–5.1)
Sodium: 138 mmol/L (ref 135–145)

## 2016-05-15 LAB — URINALYSIS, ROUTINE W REFLEX MICROSCOPIC
BILIRUBIN URINE: NEGATIVE
Bacteria, UA: NONE SEEN
Glucose, UA: NEGATIVE mg/dL
HGB URINE DIPSTICK: NEGATIVE
Ketones, ur: 5 mg/dL — AB
Leukocytes, UA: NEGATIVE
Nitrite: NEGATIVE
Protein, ur: 30 mg/dL — AB
Specific Gravity, Urine: 1.026 (ref 1.005–1.030)
Squamous Epithelial / LPF: NONE SEEN
pH: 5 (ref 5.0–8.0)

## 2016-05-15 LAB — CBC
HCT: 40 % (ref 39.0–52.0)
HEMOGLOBIN: 13.7 g/dL (ref 13.0–17.0)
MCH: 34.4 pg — AB (ref 26.0–34.0)
MCHC: 34.3 g/dL (ref 30.0–36.0)
MCV: 100.5 fL — ABNORMAL HIGH (ref 78.0–100.0)
Platelets: 185 10*3/uL (ref 150–400)
RBC: 3.98 MIL/uL — AB (ref 4.22–5.81)
RDW: 13.4 % (ref 11.5–15.5)
WBC: 9.8 10*3/uL (ref 4.0–10.5)

## 2016-05-15 MED ORDER — SODIUM CHLORIDE 0.9 % IV BOLUS (SEPSIS)
1000.0000 mL | Freq: Once | INTRAVENOUS | Status: AC
  Administered 2016-05-15: 1000 mL via INTRAVENOUS

## 2016-05-15 NOTE — ED Triage Notes (Addendum)
To ED via EMS from church: feeling faint, told wife feeling hot, vomited in church, then assisted out into the hall. Wife states he did not lose conciousness. Patient does not remember what happened. Wife reports he has trouble with short term memory and seems his usual mental status. Denies nausea at this time. Denies pain or dizziness at this time.

## 2016-05-15 NOTE — ED Notes (Signed)
Patient transported to X-ray 

## 2016-05-15 NOTE — ED Provider Notes (Signed)
[facility] DEPT Provider Note   CSN: [id number] Arrival date & time: 05/15/16 1107     History    Chief Complaint  Patient presents with  . Near Syncope  . Nausea     HPI Arthur Mcdaniel is a 83 y.o. male.  83yo M w/ PMH including memory problems, CAD, HLD, Paroxysmal atrial fibrillation who presents with nausea and vomiting. Wife reports that they were sitting in church when the patient told her that he felt hot. He took off his jacket and then said he felt nauseated. He then vomited. He stood up and walked out of church and had several episodes of vomiting. Wife states that he never lost consciousness and did not fall or hit his head. He reports that he is at his mental status baseline. The patient denies any chest pain, shortness of breath, lightheadedness, fevers, diarrhea, abdominal pain, or recent illness. No urinary problems. He ate a normal breakfast this morning. He does not recall the events but he does have short-term memory problems according to wife. He has been coughing since the vomiting episode but has not had recent cough.   Past Medical History:  Diagnosis Date  . Detached retina   . GERD (gastroesophageal reflux disease)   . Gout   . Hyperlipidemia    Myalgias with Lipitor  . Hypothyroidism   . Ischemic heart disease    Remote stent to LCX in 1997  . Melanoma ([place])   . Memory deficit   . Normal nuclear stress test June 2012  . Obese   . PAF (paroxysmal atrial fibrillation) [facility])      Patient Active Problem List   Diagnosis Date Noted  . Mild dementia 07/29/2015  . Mild cognitive impairment 01/15/2015  . Essential hypertension 01/15/2015  . Diabetes mellitus type 2, uncomplicated ([place]) [id number]  . Diabetic polyneuropathy associated with diabetes mellitus due to underlying condition ([place]) 01/15/2015  . Cognitive changes 02/12/2014  . Choledocholithiasis 02/11/2014  . Choledocholithiasis with cholecystitis 02/11/2014  . CAD (coronary artery  disease) 01/20/2011  . PAF (paroxysmal atrial fibrillation) ([place]) 01/20/2011  . HTN (hypertension) 01/20/2011  . Hyperlipidemia 01/20/2011    Past Surgical History:  Procedure Laterality Date  . CHOLECYSTECTOMY N/A 02/13/2014   Procedure: LAPAROSCOPIC CHOLECYSTECTOMY ;  Surgeon: Autumn Messing III, MD;  Location: [place];  Service: General;  Laterality: N/A;  . CORONARY STENT PLACEMENT  1997   LCX        Home Medications    Prior to Admission medications   Medication Sig Start Date End Date Taking? Authorizing Provider  allopurinol (ZYLOPRIM) 300 MG tablet Take 300 mg by mouth daily.      Historical Provider, MD  amLODipine (NORVASC) 2.5 MG tablet TAKE ONE TABLET BY MOUTH ONCE DAILY 03/28/16   Peter M Martinique, MD  aspirin 81 MG tablet Take 81 mg by mouth daily.      Historical Provider, MD  cycloSPORINE (RESTASIS) 0.05 % ophthalmic emulsion Place 1 drop into both eyes 2 (two) times daily.    Historical Provider, MD  donepezil (ARICEPT) 10 MG tablet Take 10 mg by mouth at bedtime. Take 1 tablet at bedtime 01/07/15   Historical Provider, MD  fluticasone (FLONASE) 50 MCG/ACT nasal spray Place 2 sprays into the nose as needed. Reported on 07/29/2015    Historical Provider, MD  levothyroxine (SYNTHROID, LEVOTHROID) 100 MCG tablet 1 tablet. Take 1 tablet daily 07/20/15   Historical Provider, MD  metFORMIN (GLUCOPHAGE) 850 MG tablet Take 850 mg by  mouth 2 (two) times daily with a meal.    Historical Provider, MD  nitroGLYCERIN (NITROSTAT) 0.4 MG SL tablet Place 1 tablet (0.4 mg total) under the tongue every 5 (five) minutes as needed. 01/20/11   Burtis Junes, NP  pyridOXINE (VITAMIN B-6) 100 MG tablet Take 100 mg by mouth daily.    Historical Provider, MD  simvastatin (ZOCOR) 20 MG tablet Take 20 mg by mouth at bedtime.      Historical Provider, MD  vitamin B-12 (CYANOCOBALAMIN) 1000 MCG tablet Take 1,000 mcg by mouth daily.      Historical Provider, MD      Family History  Problem Relation Age  of Onset  . Heart disease Sister      Social History  Substance Use Topics  . Smoking status: Never Smoker  . Smokeless tobacco: Never Used  . Alcohol use No     Allergies     Naproxen    Review of Systems  10 Systems reviewed and are negative for acute change except as noted in the HPI.   Physical Exam Updated Vital Signs BP 139/78 (BP Location: Right Arm)   Pulse 81   Temp 97.6 F (36.4 C) (Oral)   Resp 12   SpO2 98%   Physical Exam  Constitutional: He is oriented to person, place, and time. He appears well-developed and well-nourished. No distress.  Awake, alert  HENT:  Head: Normocephalic and atraumatic.  Eyes: Conjunctivae and EOM are normal. Pupils are equal, round, and reactive to light.  Neck: Neck supple.  Cardiovascular: Normal rate, regular rhythm and normal heart sounds.   No murmur heard. Pulmonary/Chest: Effort normal. No respiratory distress.  Coarse BS b/l w/ expiratory wheezes R lung base  Abdominal: Soft. Bowel sounds are normal. He exhibits no distension. There is no tenderness.  Musculoskeletal: He exhibits no edema.  Neurological: He is alert and oriented to person, place, and time. He has normal reflexes. No cranial nerve deficit. He exhibits normal muscle tone.  Fluent speech, normal finger-to-nose testing, negative pronator drift, no clonus 5/5 strength and normal sensation x all 4 extremities Some short term memory loss during conversation  Skin: Skin is warm and dry.  Psychiatric: He has a normal mood and affect.  Nursing note and vitals reviewed.     ED Treatments / Results  Labs (all labs ordered are listed, but only abnormal results are displayed) Labs Reviewed  BASIC METABOLIC PANEL - Abnormal; Notable for the following:       Result Value   CO2 20 (*)    Glucose, Bld 142 (*)    Creatinine, Ser 1.32 (*)    GFR calc non Af Amer 48 (*)    GFR calc Af Amer 56 (*)    All other components within normal limits  CBC -  Abnormal; Notable for the following:    RBC 3.98 (*)    MCV 100.5 (*)    MCH 34.4 (*)    All other components within normal limits  URINALYSIS, ROUTINE W REFLEX MICROSCOPIC - Abnormal; Notable for the following:    Color, Urine AMBER (*)    APPearance HAZY (*)    Ketones, ur 5 (*)    Protein, ur 30 (*)    All other components within normal limits     EKG  EKG Interpretation  Date/Time:  Sunday May 15 2016 11:19:33 EST Ventricular Rate:  67 PR Interval:    QRS Duration: 92 QT Interval:  442 QTC Calculation: 467  R Axis:   -4 Text Interpretation:  Sinus rhythm Atrial premature complex RSR' in V1 or V2, probably normal variant Borderline T wave abnormalities No significant change since last tracing Confirmed by LITTLE MD, RACHEL [phone]) on 05/15/2016 12:27:57 PM         Radiology Dg Chest 2 View  Result Date: 05/15/2016 CLINICAL DATA:  Near syncope and vomiting this morning. EXAM: CHEST  2 VIEW COMPARISON:  Single-view of the chest 02/14/2014. FINDINGS: Lungs are clear. Heart size is normal. No pneumothorax or pleural effusion. No acute bony abnormality. IMPRESSION: No acute disease. Electronically Signed   By: Inge Rise M.D.   On: 05/15/2016 11:57    Procedures Procedures (including critical care time) Procedures  Medications Ordered in ED  Medications  sodium chloride 0.9 % bolus 1,000 mL (0 mLs Intravenous Stopped 05/15/16 1348)     Initial Impression / Assessment and Plan / ED Course  I have reviewed the triage vital signs and the nursing notes.  Pertinent labs & imaging results that were available during my care of the patient were reviewed by me and considered in my medical decision making (see chart for details).  Clinical Course    Pt w/ episode of feeling hot followed by vomiting during church. He was awake and alert on arrival with no complaints. Abdomen soft and nontender. Vital signs unremarkable. EKG without acute ischemic changes. With the  exception of some short term memory problems, his neurologic exam was normal. Obtained above lab work which was notable only for mild AK I with creatinine 1.32. Chest x-ray unremarkable. They've the patient an IV fluid bolus and afterwards he was able to ambulate with no symptoms. He requested to go home. Since patient did not have actual syncope and has no symptoms currently, I feel he is appropriate for discharge. I discussed lab findings and instructed patient to hydrate well at home. Instructed on return precautions patient voiced understanding. No complaints at time of discharge and patient discharged in satisfactory condition.  Final Clinical Impressions(s) / ED Diagnoses   Final diagnoses:  Non-intractable vomiting with nausea, unspecified vomiting type  Dehydration     New Prescriptions   No medications on file       Sharlett Iles, MD 05/15/16 1402

## 2016-05-15 NOTE — ED Notes (Signed)
PT walked to the restroom by himself with no issue.

## 2016-10-07 ENCOUNTER — Other Ambulatory Visit: Payer: Self-pay | Admitting: Cardiology

## 2016-11-14 ENCOUNTER — Other Ambulatory Visit: Payer: Self-pay | Admitting: Cardiology

## 2016-12-12 ENCOUNTER — Other Ambulatory Visit: Payer: Self-pay | Admitting: Cardiology

## 2017-01-11 ENCOUNTER — Other Ambulatory Visit: Payer: Self-pay | Admitting: Cardiology

## 2017-01-13 ENCOUNTER — Other Ambulatory Visit: Payer: Self-pay | Admitting: Cardiology

## 2017-01-16 ENCOUNTER — Other Ambulatory Visit: Payer: Self-pay | Admitting: Cardiology

## 2017-02-03 ENCOUNTER — Ambulatory Visit (INDEPENDENT_AMBULATORY_CARE_PROVIDER_SITE_OTHER): Payer: Medicare Other | Admitting: Neurology

## 2017-02-03 ENCOUNTER — Encounter: Payer: Self-pay | Admitting: Neurology

## 2017-02-03 VITALS — BP 120/72 | HR 81 | Ht 71.0 in | Wt 185.0 lb

## 2017-02-03 DIAGNOSIS — G3184 Mild cognitive impairment, so stated: Secondary | ICD-10-CM

## 2017-02-03 MED ORDER — DONEPEZIL HCL 10 MG PO TABS: ORAL_TABLET | ORAL | 3 refills | Status: DC

## 2017-02-03 NOTE — Patient Instructions (Addendum)
1. Continue Donepezil 10mg  daily 2. Follow-up in 1 year, call for any changes  RECOMMENDATIONS FOR ALL PATIENTS WITH MEMORY PROBLEMS: 1. Continue to exercise (Recommend 30 minutes of walking everyday, or 3 hours every week) 2. Increase social interactions - continue going to [place] and enjoy social gatherings with friends and family 3. Eat healthy, avoid fried foods and eat more fruits and vegetables 4. Maintain adequate blood pressure, blood sugar, and blood cholesterol level. Reducing the risk of stroke and cardiovascular disease also helps promoting better memory. 5. Avoid stressful situations. Live a simple life and avoid aggravations. Organize your time and prepare for the next day in anticipation. 6. Sleep well, avoid any interruptions of sleep and avoid any distractions in the bedroom that may interfere with adequate sleep quality 7. Avoid sugar, avoid sweets as there is a strong link between excessive sugar intake, diabetes, and cognitive impairment We discussed the Mediterranean diet, which has been shown to help patients reduce the risk of progressive memory disorders and reduces cardiovascular risk. This includes eating fish, eat fruits and green leafy vegetables, nuts like almonds and hazelnuts, walnuts, and also use olive oil. Avoid fast foods and fried foods as much as possible. Avoid sweets and sugar as sugar use has been linked to worsening of memory function.  There is always a concern of gradual progression of memory problems. If this is the case, then we may need to adjust level of care according to patient needs. Support, both to the patient and caregiver, should then be put into place.

## 2017-02-03 NOTE — Progress Notes (Signed)
NEUROLOGY FOLLOW UP OFFICE NOTE  DEBRA CALABRETTA [id number]  HISTORY OF PRESENT ILLNESS: I had the pleasure of seeing Roxie Gueye in follow-up in the neurology clinic on 02/03/2017. He is again accompanied by his wife who helps supplement the history today. The patient was last seen a year ago for worsening memory. MMSE in September 2017 was 23/30 (23/30 in March 2017, 26/30 in September 2016). He had been taking Aricept 10mg  daily without any side effects. Since his last visit, he and his wife feel his memory is unchanged, he continues to notice short- and long-term memory changes. He continues to drive without getting lost. He does have to think more about the directions when going to unfamiliar places. He denies any missed bills or medications. He denies any headaches, dizziness, diplopia, dysarthria, dysphagia, neck/back pain, focal numbness/tingling/weakness, bowel/bladder dysfunction. No falls. No difficulties with ADLs.  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 He had previously been evaluated by neurologist Dr. Rexene Alberts in August 2015. His MMSE at that time was 25/30. MRI brain and neurocognitive testing was recommended. He reports going for the MRI but could not proceed due to claustrophobia. His wife reports that her daughter had noticed the memory changes at that time, but she herself noticed changes more in the past couple of months. His long-term memory is excellent, but he cannot recall things from 2 hours ago. He denies any significant word-finding difficulties, if he can't find the word, he is able to substitute pretty quickly. He would ask the same questions repeatedly, or ask questions about their destination after they talked about where to go. He reports he has always been like this, that he is an Chief Financial Officer and has a  habit of asking things repeatedly, looking at a map 5 times to see where he is going. His wife denies any concerns about his driving, they continue to drive to [place] without difficulties. He did get turned around yesterday getting to his appointment here, and had to reschedule to today, but states this was due to thinking the building was on the other side of the street. He denies any missed bill payments except for one or two in the past year, but states he does find himself rechecking his ledger more often now to see if all the bills have been paid. He denies any missed medications. No difficulties multitasking. No personality changes such as paranoia, but he does get more aggravated at certain things. No hallucinations. He continues to be active, mowing the lawn and working out at [employer]. 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 He was started on Aricept 10mg  daily in 2016, with no side effects. He denies any significant head injuries. His mother had memory changes in her 23s.   Laboratory Data: 04/2014: Vitamin B12 764 11/2014: TSH 0.736  PAST MEDICAL HISTORY: Past Medical History:  Diagnosis Date  . Detached retina   . GERD (gastroesophageal reflux disease)   . Gout   . Hyperlipidemia    Myalgias with Lipitor  . Hypothyroidism   . Ischemic heart disease    Remote stent to LCX in 1997  . Melanoma ([place])   . Memory deficit   . Normal nuclear stress test June 2012  . Obese   . PAF (paroxysmal atrial fibrillation) ([place])  MEDICATIONS: Current Outpatient Prescriptions on File Prior to Visit  Medication Sig Dispense Refill  . allopurinol (ZYLOPRIM) 300 MG tablet Take 300 mg by mouth daily.      Marland Kitchen amLODipine (NORVASC) 2.5 MG tablet Take 1 tablet (2.5 mg total) by mouth daily. Please schedule appointment for refills 30 tablet 0  . amLODipine (NORVASC) 2.5 MG tablet TAKE 1 TABLET BY MOUTH ONCE DAILY 30 tablet 0  . aspirin  81 MG tablet Take 81 mg by mouth daily.      . cycloSPORINE (RESTASIS) 0.05 % ophthalmic emulsion Place 1 drop into both eyes 2 (two) times daily.    Marland Kitchen donepezil (ARICEPT) 10 MG tablet Take 10 mg by mouth at bedtime. Take 1 tablet at bedtime  0  . fluticasone (FLONASE) 50 MCG/ACT nasal spray Place 2 sprays into the nose as needed. Reported on 07/29/2015    . levothyroxine (SYNTHROID, LEVOTHROID) 100 MCG tablet 1 tablet. Take 1 tablet daily  0  . metFORMIN (GLUCOPHAGE) 850 MG tablet Take 850 mg by mouth 2 (two) times daily with a meal.    . nitroGLYCERIN (NITROSTAT) 0.4 MG SL tablet Place 1 tablet (0.4 mg total) under the tongue every 5 (five) minutes as needed. 25 tablet 11  . pyridOXINE (VITAMIN B-6) 100 MG tablet Take 100 mg by mouth daily.    . simvastatin (ZOCOR) 20 MG tablet Take 20 mg by mouth at bedtime.      . vitamin B-12 (CYANOCOBALAMIN) 1000 MCG tablet Take 1,000 mcg by mouth daily.       No current facility-administered medications on file prior to visit.     ALLERGIES: Allergies  Allergen Reactions  . Naproxen Other (See Comments)    Doesn't remember     FAMILY HISTORY: Family History  Problem Relation Age of Onset  . Heart disease Sister     SOCIAL HISTORY: Social History   Social History  . Marital status: Married    Spouse name: [name]  . Number of children: 2  . Years of education: 70   Occupational History  . Retired    Social History Main Topics  . Smoking status: Never Smoker  . Smokeless tobacco: Never Used  . Alcohol use No  . Drug use: No  . Sexual activity: Not on file   Other Topics Concern  . Not on file   Social History Narrative   Patient is right handed and resides with wife in a home    REVIEW OF SYSTEMS: Constitutional: No fevers, chills, or sweats, no generalized fatigue, change in appetite Eyes: No visual changes, double vision, eye pain Ear, nose and throat: No hearing loss, ear pain, nasal congestion, sore  throat Cardiovascular: No chest pain, palpitations Respiratory:  No shortness of breath at rest or with exertion, wheezes GastrointestinaI: No nausea, vomiting, diarrhea, abdominal pain, fecal incontinence Genitourinary:  No dysuria, urinary retention or frequency Musculoskeletal:  No neck pain, back pain Integumentary: No rash, pruritus, skin lesions Neurological: as above Psychiatric: No depression, insomnia, anxiety Endocrine: No palpitations, fatigue, diaphoresis, mood swings, change in appetite, change in weight, increased thirst Hematologic/Lymphatic:  No anemia, purpura, petechiae. Allergic/Immunologic: no itchy/runny eyes, nasal congestion, recent allergic reactions, rashes  PHYSICAL EXAM: Vitals:   02/03/17 0955  BP: 120/72  Pulse: 81  SpO2: 95%   General: No acute distress Head:  Normocephalic/atraumatic Neck: supple, no paraspinal tenderness, full range of motion Heart:  Regular rate and rhythm Lungs:  Clear to auscultation bilaterally Back: No paraspinal tenderness Skin/Extremities:  No rash, no edema Neurological Exam: alert and oriented to person, place, season (stated it is Monday, March 2015). No aphasia or dysarthria. Fund of knowledge is appropriate.  Remote memory intact.  Attention and concentration are normal.    Able to name objects and repeat phrases. CDT 5/5 MMSE - Mini Mental State Exam 02/03/2017 02/03/2016 07/29/2015  Orientation to time 1 2 2   Orientation to Place 5 4 5   Registration 3 3 3   Attention/ Calculation 5 5 4   Recall 0 0 0  Language- name 2 objects 2 2 2   Language- repeat 1 1 1   Language- follow 3 step command 3 3 3   Language- read & follow direction 1 1 1   Write a sentence 1 1 1   Copy design 1 1 1   Total score 23 23 23    Cranial nerves: Pupils equal, round, reactive to light. Extraocular movements intact with no nystagmus. Visual fields full. Facial sensation intact. No facial asymmetry. Tongue, uvula, palate midline.  Motor: Bulk and tone  normal, muscle strength 5/5 throughout with no pronator drift.  Sensation to light touch intact.  No extinction to double simultaneous stimulation.  Deep tendon reflexes 2+ throughout, toes downgoing.  Finger to nose testing intact.  Gait narrow-based and steady, able to tandem walk adequately.  Romberg negative.  IMPRESSION: This is a pleasant 83 yo RH man with a vascular risk factors including hypertension, hyperlipidemia, diabetes, paroxysmal atrial fibrillation, with worsening short-term memory loss. Neurological exam non-focal. MMSE today again 23/30, (similar to September 2017, March 2017, previously 26/30 in September 2016), indicating mild dementia, however by history suggestive of mild cognitive impairment. He continues to independently manage bills and medications. Continue daily Aricept 10mg . We again discussed the importance of control of vascular risk factors, physical exercise, and brain stimulation exercises for brain health. Continue to monitor driving. He will follow-up in 1 year and knows to call our office for any changes.   Thank you for allowing me to participate in his care.  Please do not hesitate to call for any questions or concerns.  The duration of this appointment visit was 25 minutes of face-to-face time with the patient.  Greater than 50% of this time was spent in counseling, explanation of diagnosis, planning of further management, and coordination of care.   Ellouise Newer, M.D.   CC: Dr. Shelia Media

## 2017-02-04 ENCOUNTER — Other Ambulatory Visit: Payer: Self-pay | Admitting: Cardiology

## 2017-02-06 NOTE — Telephone Encounter (Signed)
REFILL 

## 2017-02-22 ENCOUNTER — Other Ambulatory Visit: Payer: Self-pay | Admitting: Cardiology

## 2017-02-22 NOTE — Telephone Encounter (Signed)
Rx(s) sent to pharmacy electronically.  

## 2017-02-22 NOTE — Telephone Encounter (Signed)
Amlodipine refilled 02/22/17

## 2017-06-12 ENCOUNTER — Other Ambulatory Visit: Payer: Self-pay | Admitting: Cardiology

## 2017-06-28 ENCOUNTER — Telehealth: Payer: Self-pay | Admitting: Cardiology

## 2017-06-28 MED ORDER — AMLODIPINE BESYLATE 2.5 MG PO TABS: 2.5000 mg | ORAL_TABLET | Freq: Every day | ORAL | 1 refills | Status: DC

## 2017-06-28 NOTE — Telephone Encounter (Signed)
Pt's wife calling.Pt is out of meds and don't have any refills left on pt's Amlodipine 2.5mg . Please advise

## 2017-06-28 NOTE — Telephone Encounter (Signed)
Returned call to patient's wife.Amlodipine refill sent to pharmacy.Appointment scheduled with Dr.Jordan 07/21/17 at 2:00 pm.

## 2017-07-17 NOTE — Progress Notes (Signed)
Arthur Mcdaniel Date of Birth: March 18, 1931 Medical Record #[id number]  History of Present Illness: Arthur Mcdaniel is seen today for follow up CAD. He has a history of coronary disease and underwent stenting of the left circumflex coronary in 1997. He also has a prior history of atrial fibrillation during a bout of pleurisy in 2008 but has been in sinus rhythm since then. He has a history of hypercholesterolemia, mild glucose intolerance, and hypertension. His last nuclear stress test in June of 2012 was normal with an ejection fraction of 65%.   On follow up today he reports he is doing very well. He denies any chest pain, dyspnea, palpitations. He complaints of trigger fingers in both hands. [facility] and [facility]. He has no limitation. He has gained 23 lbs since last visit.   Current Outpatient Medications on File Prior to Visit  Medication Sig Dispense Refill  . allopurinol (ZYLOPRIM) 300 MG tablet Take 300 mg by mouth daily.      Marland Kitchen amLODipine (NORVASC) 2.5 MG tablet Take 1 tablet (2.5 mg total) by mouth daily. <PLEASE MAKE APPOINTMENT FOR REFILLS> 15 tablet 0  . amLODipine (NORVASC) 2.5 MG tablet Take 1 tablet (2.5 mg total) by mouth daily. 30 tablet 1  . aspirin 81 MG tablet Take 81 mg by mouth daily.      . cycloSPORINE (RESTASIS) 0.05 % ophthalmic emulsion Place 1 drop into both eyes 2 (two) times daily.    Marland Kitchen donepezil (ARICEPT) 10 MG tablet Take 1 tablet at bedtime 90 tablet 3  . fluticasone (FLONASE) 50 MCG/ACT nasal spray Place 2 sprays into the nose as needed. Reported on 07/29/2015    . levothyroxine (SYNTHROID, LEVOTHROID) 100 MCG tablet 1 tablet. Take 1 tablet daily  0  . metFORMIN (GLUCOPHAGE) 850 MG tablet Take 850 mg by mouth 2 (two) times daily with a meal.    . nitroGLYCERIN (NITROSTAT) 0.4 MG SL tablet Place 1 tablet (0.4 mg total) under the tongue every 5 (five) minutes as needed. 25 tablet 11  . pyridOXINE (VITAMIN B-6) 100 MG tablet Take 100 mg  by mouth daily.    . simvastatin (ZOCOR) 20 MG tablet Take 20 mg by mouth at bedtime.      . vitamin B-12 (CYANOCOBALAMIN) 1000 MCG tablet Take 1,000 mcg by mouth daily.       No current facility-administered medications on file prior to visit.     Allergies  Allergen Reactions  . Naproxen Other (See Comments)    Doesn't remember     Past Medical History:  Diagnosis Date  . Detached retina   . GERD (gastroesophageal reflux disease)   . Gout   . Hyperlipidemia    Myalgias with Lipitor  . Hypothyroidism   . Ischemic heart disease    Remote stent to LCX in 1997  . Melanoma ([place])   . Memory deficit   . Normal nuclear stress test June 2012  . Obese   . PAF (paroxysmal atrial fibrillation) ([place])     Past Surgical History:  Procedure Laterality Date  . CHOLECYSTECTOMY N/A 02/13/2014   Procedure: LAPAROSCOPIC CHOLECYSTECTOMY ;  Surgeon: Autumn Messing III, MD;  Location: [facility];  Service: General;  Laterality: N/A;  . CORONARY STENT PLACEMENT  1997   LCX    Social History   Tobacco Use  Smoking Status Never Smoker  Smokeless Tobacco Never Used    Social History   Substance and Sexual Activity  Alcohol Use  No  . Alcohol/week: 0.0 oz    Family History  Problem Relation Age of Onset  . Heart disease Sister     Review of Systems: As noted in history of present illness.  All other systems were reviewed and are negative.  Physical Exam: BP 124/72   Pulse 66   Ht 5\' 11"  (1.803 m)   Wt 208 lb (94.3 kg)   BMI 29.01 kg/m  GENERAL:  Well appearing HEENT:  PERRL, EOMI, sclera are clear. Oropharynx is clear. NECK:  No jugular venous distention, carotid upstroke brisk and symmetric, no bruits, no thyromegaly or adenopathy LUNGS:  Clear to auscultation bilaterally CHEST:  Unremarkable HEART:  RRR,  PMI not displaced or sustained,S1 and S2 within normal limits, no S3, no S4: no clicks, no rubs, no murmurs ABD:  Soft, nontender. BS +, no masses or bruits. No hepatomegaly, no  splenomegaly EXT:  2 + pulses throughout, no edema, no cyanosis no clubbing. Trigger fingers noted bilaterally SKIN:  Warm and dry.  No rashes NEURO:  Alert and oriented x 3. Cranial nerves II through XII intact. PSYCH:  Cognitively intact    LABORATORY DATA: Lab Results  Component Value Date   WBC 9.8 05/15/2016   HGB 13.7 05/15/2016   HCT 40.0 05/15/2016   PLT 185 05/15/2016   GLUCOSE 142 (H) 05/15/2016   ALT 32 02/16/2014   AST 40 (H) 02/16/2014   NA 138 05/15/2016   K 4.5 05/15/2016   CL 105 05/15/2016   CREATININE 1.32 (H) 05/15/2016   BUN 18 05/15/2016   CO2 20 (L) 05/15/2016   INR 1.07 02/12/2014   Labs dated 06/30/17: cholesterol 206, triglycerides 87, HDL 64, LDL 90. A1c 6.1%.    Assessment / Plan: 1. CAD status post stenting of the left circumflex coronary in 1997. Normal nuclear stress test June of 2012. He remains asymptomatic.  Continue aspirin and statin therapy. Beta blocker previously discontinued for chronotropic incompetence.  2. Remote history of paroxysmal atrial fibrillation in setting of pleurisy. No recurrence.  3. Hyperlipidemia, on Zocor. LDL 90. Needs to focus on weight loss and dietary modification.  4. Hypertension, controlled.

## 2017-07-21 ENCOUNTER — Ambulatory Visit: Payer: Medicare Other | Admitting: Cardiology

## 2017-07-21 ENCOUNTER — Encounter: Payer: Self-pay | Admitting: Cardiology

## 2017-07-21 VITALS — BP 124/72 | HR 66 | Ht 71.0 in | Wt 208.0 lb

## 2017-07-21 DIAGNOSIS — I480 Paroxysmal atrial fibrillation: Secondary | ICD-10-CM | POA: Diagnosis not present

## 2017-07-21 DIAGNOSIS — I2510 Atherosclerotic heart disease of native coronary artery without angina pectoris: Secondary | ICD-10-CM

## 2017-07-21 NOTE — Patient Instructions (Signed)
Try and focus on your diet - eat less sweets and simple carbs- bread, rice, pasta, potatoes.  Continue your current therapy  I will see you in one year

## 2017-11-16 ENCOUNTER — Other Ambulatory Visit: Payer: Self-pay | Admitting: Cardiology

## 2017-11-16 ENCOUNTER — Other Ambulatory Visit: Payer: Self-pay

## 2017-11-16 MED ORDER — AMLODIPINE BESYLATE 2.5 MG PO TABS: 2.5000 mg | ORAL_TABLET | Freq: Every day | ORAL | 2 refills | Status: AC

## 2018-01-16 ENCOUNTER — Other Ambulatory Visit: Payer: Self-pay | Admitting: Internal Medicine

## 2018-01-16 DIAGNOSIS — I491 Atrial premature depolarization: Secondary | ICD-10-CM

## 2018-01-16 DIAGNOSIS — I499 Cardiac arrhythmia, unspecified: Secondary | ICD-10-CM

## 2018-01-16 DIAGNOSIS — I480 Paroxysmal atrial fibrillation: Secondary | ICD-10-CM

## 2018-01-22 ENCOUNTER — Observation Stay (HOSPITAL_BASED_OUTPATIENT_CLINIC_OR_DEPARTMENT_OTHER): Payer: Medicare Other

## 2018-01-22 ENCOUNTER — Other Ambulatory Visit (HOSPITAL_COMMUNITY): Payer: Medicare Other

## 2018-01-22 ENCOUNTER — Encounter (HOSPITAL_COMMUNITY): Payer: Self-pay | Admitting: Emergency Medicine

## 2018-01-22 ENCOUNTER — Emergency Department (HOSPITAL_COMMUNITY): Payer: Medicare Other

## 2018-01-22 ENCOUNTER — Other Ambulatory Visit: Payer: Self-pay

## 2018-01-22 ENCOUNTER — Observation Stay (HOSPITAL_COMMUNITY): Payer: Medicare Other

## 2018-01-22 ENCOUNTER — Observation Stay (HOSPITAL_COMMUNITY)
Admission: EM | Admit: 2018-01-22 | Discharge: 2018-01-23 | Disposition: A | Discharge status: Home Health | Payer: Medicare Other | Attending: Internal Medicine | Admitting: Internal Medicine

## 2018-01-22 DIAGNOSIS — Z7984 Long term (current) use of oral hypoglycemic drugs: Secondary | ICD-10-CM | POA: Insufficient documentation

## 2018-01-22 DIAGNOSIS — E1142 Type 2 diabetes mellitus with diabetic polyneuropathy: Secondary | ICD-10-CM | POA: Diagnosis not present

## 2018-01-22 DIAGNOSIS — G459 Transient cerebral ischemic attack, unspecified: Secondary | ICD-10-CM | POA: Diagnosis present

## 2018-01-22 DIAGNOSIS — I10 Essential (primary) hypertension: Secondary | ICD-10-CM | POA: Diagnosis present

## 2018-01-22 DIAGNOSIS — Z7902 Long term (current) use of antithrombotics/antiplatelets: Secondary | ICD-10-CM | POA: Diagnosis not present

## 2018-01-22 DIAGNOSIS — Z6826 Body mass index (BMI) 26.0-26.9, adult: Secondary | ICD-10-CM | POA: Insufficient documentation

## 2018-01-22 DIAGNOSIS — M109 Gout, unspecified: Secondary | ICD-10-CM | POA: Insufficient documentation

## 2018-01-22 DIAGNOSIS — E119 Type 2 diabetes mellitus without complications: Secondary | ICD-10-CM | POA: Diagnosis not present

## 2018-01-22 DIAGNOSIS — I2510 Atherosclerotic heart disease of native coronary artery without angina pectoris: Secondary | ICD-10-CM | POA: Insufficient documentation

## 2018-01-22 DIAGNOSIS — I639 Cerebral infarction, unspecified: Principal | ICD-10-CM | POA: Insufficient documentation

## 2018-01-22 DIAGNOSIS — I6521 Occlusion and stenosis of right carotid artery: Secondary | ICD-10-CM | POA: Insufficient documentation

## 2018-01-22 DIAGNOSIS — I480 Paroxysmal atrial fibrillation: Secondary | ICD-10-CM | POA: Diagnosis not present

## 2018-01-22 DIAGNOSIS — Z7982 Long term (current) use of aspirin: Secondary | ICD-10-CM | POA: Diagnosis not present

## 2018-01-22 DIAGNOSIS — I351 Nonrheumatic aortic (valve) insufficiency: Secondary | ICD-10-CM | POA: Diagnosis not present

## 2018-01-22 DIAGNOSIS — E669 Obesity, unspecified: Secondary | ICD-10-CM | POA: Insufficient documentation

## 2018-01-22 DIAGNOSIS — E039 Hypothyroidism, unspecified: Secondary | ICD-10-CM | POA: Diagnosis not present

## 2018-01-22 DIAGNOSIS — Z955 Presence of coronary angioplasty implant and graft: Secondary | ICD-10-CM | POA: Insufficient documentation

## 2018-01-22 DIAGNOSIS — Z8673 Personal history of transient ischemic attack (TIA), and cerebral infarction without residual deficits: Secondary | ICD-10-CM | POA: Insufficient documentation

## 2018-01-22 DIAGNOSIS — R4189 Other symptoms and signs involving cognitive functions and awareness: Secondary | ICD-10-CM | POA: Diagnosis present

## 2018-01-22 DIAGNOSIS — Z85820 Personal history of malignant melanoma of skin: Secondary | ICD-10-CM | POA: Diagnosis not present

## 2018-01-22 DIAGNOSIS — Z79890 Hormone replacement therapy (postmenopausal): Secondary | ICD-10-CM | POA: Diagnosis not present

## 2018-01-22 DIAGNOSIS — G8194 Hemiplegia, unspecified affecting left nondominant side: Secondary | ICD-10-CM | POA: Diagnosis not present

## 2018-01-22 DIAGNOSIS — R29706 NIHSS score 6: Secondary | ICD-10-CM | POA: Insufficient documentation

## 2018-01-22 DIAGNOSIS — R29810 Facial weakness: Secondary | ICD-10-CM | POA: Insufficient documentation

## 2018-01-22 DIAGNOSIS — F0390 Unspecified dementia without behavioral disturbance: Secondary | ICD-10-CM | POA: Diagnosis not present

## 2018-01-22 DIAGNOSIS — Z79899 Other long term (current) drug therapy: Secondary | ICD-10-CM | POA: Diagnosis not present

## 2018-01-22 DIAGNOSIS — F03A0 Unspecified dementia, mild, without behavioral disturbance, psychotic disturbance, mood disturbance, and anxiety: Secondary | ICD-10-CM | POA: Diagnosis present

## 2018-01-22 DIAGNOSIS — E785 Hyperlipidemia, unspecified: Secondary | ICD-10-CM | POA: Diagnosis present

## 2018-01-22 LAB — URINALYSIS, ROUTINE W REFLEX MICROSCOPIC
BACTERIA UA: NONE SEEN
Bilirubin Urine: NEGATIVE
Glucose, UA: NEGATIVE mg/dL
Ketones, ur: 5 mg/dL — AB
Leukocytes, UA: NEGATIVE
Nitrite: NEGATIVE
PH: 7 (ref 5.0–8.0)
Protein, ur: NEGATIVE mg/dL
SPECIFIC GRAVITY, URINE: 1.016 (ref 1.005–1.030)

## 2018-01-22 LAB — RAPID URINE DRUG SCREEN, HOSP PERFORMED
Amphetamines: NOT DETECTED
Barbiturates: NOT DETECTED
Benzodiazepines: NOT DETECTED
Cocaine: NOT DETECTED
OPIATES: NOT DETECTED
TETRAHYDROCANNABINOL: NOT DETECTED

## 2018-01-22 LAB — DIFFERENTIAL
Abs Immature Granulocytes: 0 10*3/uL (ref 0.0–0.1)
BASOS ABS: 0.1 10*3/uL (ref 0.0–0.1)
Basophils Relative: 1 %
EOS PCT: 1 %
Eosinophils Absolute: 0.1 10*3/uL (ref 0.0–0.7)
IMMATURE GRANULOCYTES: 0 %
LYMPHS PCT: 15 %
Lymphs Abs: 1.4 10*3/uL (ref 0.7–4.0)
Monocytes Absolute: 0.5 10*3/uL (ref 0.1–1.0)
Monocytes Relative: 6 %
NEUTROS PCT: 77 %
Neutro Abs: 7.1 10*3/uL (ref 1.7–7.7)

## 2018-01-22 LAB — I-STAT CHEM 8, ED
BUN: 16 mg/dL (ref 8–23)
CHLORIDE: 104 mmol/L (ref 98–111)
CREATININE: 1.1 mg/dL (ref 0.61–1.24)
Calcium, Ion: 1.15 mmol/L (ref 1.15–1.40)
GLUCOSE: 168 mg/dL — AB (ref 70–99)
HCT: 41 % (ref 39.0–52.0)
Hemoglobin: 13.9 g/dL (ref 13.0–17.0)
POTASSIUM: 3.8 mmol/L (ref 3.5–5.1)
Sodium: 139 mmol/L (ref 135–145)
TCO2: 25 mmol/L (ref 22–32)

## 2018-01-22 LAB — CBC
HEMATOCRIT: 41.4 % (ref 39.0–52.0)
HEMOGLOBIN: 13.7 g/dL (ref 13.0–17.0)
MCH: 34.4 pg — ABNORMAL HIGH (ref 26.0–34.0)
MCHC: 33.1 g/dL (ref 30.0–36.0)
MCV: 104 fL — ABNORMAL HIGH (ref 78.0–100.0)
Platelets: 177 10*3/uL (ref 150–400)
RBC: 3.98 MIL/uL — ABNORMAL LOW (ref 4.22–5.81)
RDW: 13.8 % (ref 11.5–15.5)
WBC: 9.3 10*3/uL (ref 4.0–10.5)

## 2018-01-22 LAB — I-STAT TROPONIN, ED: Troponin i, poc: 0 ng/mL (ref 0.00–0.08)

## 2018-01-22 LAB — GLUCOSE, CAPILLARY
GLUCOSE-CAPILLARY: 134 mg/dL — AB (ref 70–99)
Glucose-Capillary: 116 mg/dL — ABNORMAL HIGH (ref 70–99)
Glucose-Capillary: 146 mg/dL — ABNORMAL HIGH (ref 70–99)

## 2018-01-22 LAB — COMPREHENSIVE METABOLIC PANEL
ALBUMIN: 4 g/dL (ref 3.5–5.0)
ALK PHOS: 59 U/L (ref 38–126)
ALT: 17 U/L (ref 0–44)
ANION GAP: 13 (ref 5–15)
AST: 24 U/L (ref 15–41)
BUN: 15 mg/dL (ref 8–23)
CALCIUM: 9.4 mg/dL (ref 8.9–10.3)
CO2: 21 mmol/L — ABNORMAL LOW (ref 22–32)
Chloride: 105 mmol/L (ref 98–111)
Creatinine, Ser: 1.25 mg/dL — ABNORMAL HIGH (ref 0.61–1.24)
GFR calc non Af Amer: 51 mL/min — ABNORMAL LOW (ref >60)
GFR, EST AFRICAN AMERICAN: 59 mL/min — AB (ref >60)
GLUCOSE: 167 mg/dL — AB (ref 70–99)
POTASSIUM: 4 mmol/L (ref 3.5–5.1)
SODIUM: 139 mmol/L (ref 135–145)
Total Bilirubin: 1.3 mg/dL — ABNORMAL HIGH (ref 0.3–1.2)
Total Protein: 6.5 g/dL (ref 6.5–8.1)

## 2018-01-22 LAB — CBG MONITORING, ED: Glucose-Capillary: 158 mg/dL — ABNORMAL HIGH (ref 70–99)

## 2018-01-22 LAB — ECHOCARDIOGRAM COMPLETE

## 2018-01-22 LAB — APTT: APTT: 34 s (ref 24–36)

## 2018-01-22 LAB — PROTIME-INR
INR: 1.11
Prothrombin Time: 14.2 seconds (ref 11.4–15.2)

## 2018-01-22 LAB — TSH: TSH: 31.997 u[IU]/mL — AB (ref 0.350–4.500)

## 2018-01-22 LAB — ETHANOL: Alcohol, Ethyl (B): <10 mg/dL (ref <10)

## 2018-01-22 MED ORDER — INSULIN ASPART 100 UNIT/ML ~~LOC~~ SOLN
0.0000 [IU] | Freq: Every day | SUBCUTANEOUS | Status: DC
  Filled 2018-01-22: qty 1

## 2018-01-22 MED ORDER — ALLOPURINOL 300 MG PO TABS
300.0000 mg | ORAL_TABLET | Freq: Every day | ORAL | Status: DC
  Administered 2018-01-22 – 2018-01-23 (×2): 300 mg via ORAL
  Filled 2018-01-22 (×2): qty 1

## 2018-01-22 MED ORDER — ASPIRIN EC 325 MG PO TBEC
325.0000 mg | DELAYED_RELEASE_TABLET | Freq: Every day | ORAL | Status: DC
  Administered 2018-01-22: 325 mg via ORAL
  Filled 2018-01-22: qty 1

## 2018-01-22 MED ORDER — ATORVASTATIN CALCIUM 20 MG PO TABS: 40.0000 mg | ORAL_TABLET | Freq: Every day | ORAL | Status: DC

## 2018-01-22 MED ORDER — IOPAMIDOL (ISOVUE-370) INJECTION 76%
50.0000 mL | Freq: Once | INTRAVENOUS | Status: AC | PRN
  Administered 2018-01-22: 50 mL via INTRAVENOUS

## 2018-01-22 MED ORDER — SODIUM CHLORIDE 0.9 % IV SOLN: INTRAVENOUS | Status: DC

## 2018-01-22 MED ORDER — SENNOSIDES-DOCUSATE SODIUM 8.6-50 MG PO TABS: 1.0000 | ORAL_TABLET | Freq: Every evening | ORAL | Status: DC | PRN

## 2018-01-22 MED ORDER — LORAZEPAM 2 MG/ML IJ SOLN
0.5000 mg | Freq: Once | INTRAMUSCULAR | Status: AC
  Administered 2018-01-22: 0.5 mg via INTRAVENOUS
  Filled 2018-01-22: qty 1

## 2018-01-22 MED ORDER — CLOPIDOGREL BISULFATE 75 MG PO TABS
75.0000 mg | ORAL_TABLET | Freq: Every day | ORAL | Status: DC
  Administered 2018-01-23: 75 mg via ORAL
  Filled 2018-01-22: qty 1

## 2018-01-22 MED ORDER — ACETAMINOPHEN 160 MG/5ML PO SOLN: 650.0000 mg | ORAL | Status: DC | PRN

## 2018-01-22 MED ORDER — DONEPEZIL HCL 10 MG PO TABS
10.0000 mg | ORAL_TABLET | Freq: Every day | ORAL | Status: DC
  Administered 2018-01-22: 10 mg via ORAL
  Filled 2018-01-22: qty 1

## 2018-01-22 MED ORDER — ENOXAPARIN SODIUM 40 MG/0.4ML ~~LOC~~ SOLN
40.0000 mg | SUBCUTANEOUS | Status: DC
  Administered 2018-01-22: 40 mg via SUBCUTANEOUS
  Filled 2018-01-22: qty 0.4

## 2018-01-22 MED ORDER — STROKE: EARLY STAGES OF RECOVERY BOOK
Freq: Once | Status: DC
  Filled 2018-01-22 (×2): qty 1

## 2018-01-22 MED ORDER — INSULIN ASPART 100 UNIT/ML ~~LOC~~ SOLN
0.0000 [IU] | Freq: Three times a day (TID) | SUBCUTANEOUS | Status: DC
  Administered 2018-01-22: 2 [IU] via SUBCUTANEOUS
  Administered 2018-01-23: 3 [IU] via SUBCUTANEOUS
  Administered 2018-01-23: 2 [IU] via SUBCUTANEOUS
  Filled 2018-01-22 (×3): qty 1

## 2018-01-22 MED ORDER — ATORVASTATIN CALCIUM 20 MG PO TABS
80.0000 mg | ORAL_TABLET | Freq: Every day | ORAL | Status: DC
  Administered 2018-01-22: 80 mg via ORAL
  Filled 2018-01-22: qty 4

## 2018-01-22 MED ORDER — ACETAMINOPHEN 325 MG PO TABS: 650.0000 mg | ORAL_TABLET | ORAL | Status: DC | PRN

## 2018-01-22 MED ORDER — CLOPIDOGREL BISULFATE 300 MG PO TABS
300.0000 mg | ORAL_TABLET | Freq: Once | ORAL | Status: AC
  Administered 2018-01-22: 300 mg via ORAL
  Filled 2018-01-22: qty 1

## 2018-01-22 MED ORDER — ACETAMINOPHEN 650 MG RE SUPP: 650.0000 mg | RECTAL | Status: DC | PRN

## 2018-01-22 MED ORDER — LEVOTHYROXINE SODIUM 112 MCG PO TABS
112.0000 ug | ORAL_TABLET | Freq: Every day | ORAL | Status: DC
  Administered 2018-01-23: 112 ug via ORAL
  Filled 2018-01-22: qty 1

## 2018-01-22 NOTE — ED Notes (Signed)
While assessing patient, left facial droop worsened and patient began experiencing left arm weakness, left leg weakness, dysarthria, and numbness.

## 2018-01-22 NOTE — ED Notes (Signed)
Patient transported to CT 

## 2018-01-22 NOTE — Code Documentation (Signed)
84yo male arriving to [facility] via [facility] at [facility]. Patient from home where he woke up this morning with weakness. EMS called, however, patient refused transport at that time. Patient's neighbor came over to the house and noted left sided weakness and called EMS. Patient transported to the ED. On arrival, patient with mild left facial droop per nurse, however, symptoms worsened at 1028 with left sided weakness in addition to left facial droop. Code stroke activated and patient to CT. Stroke team to CT. CT completed followed by CTA head and neck. NIHSS 6, see documentation for details and code stroke times. Patient with left facial droop, left arm and leg drift and mild dysarthria. Patient unable to answer NIHSS questions, however, patient with dementia at baseline per neighbor. LKW yesterday at 2300 prior to bed. Patient is outside the window for treatment with tPA. No acute stroke treatment at this time. Bedside handoff with ED RN Legrand Como.

## 2018-01-22 NOTE — Consult Note (Addendum)
Neurology Consultation  Reason for Consult: Code stroke Referring Physician: Melina Copa  CC: Left-sided weakness along with left facial droop  History is obtained from: Dr. Melina Copa, EMS, neighbor  HPI: Arthur Mcdaniel is a 84 y.o. male with history of PAF, memory deficit, melanoma, ischemic heart disease, hypothyroidism, hyperlipidemia, gout, GERD.  Patient went to sleep at approximately 11:00 PM last night.  Upon waking this morning, and he was noted that when he tried to get out of bed he slid down to floor secondary to weakness of his left leg and left arm.  Initially EMS was called, upon arrival patient and wife refused transport hospital.  Over time patient lost all strength in his left arm and left leg.  Neighbor came over and convinced him to call EMS and go to the hospital.  Upon entering the hospital patient strength in his left arm and left leg had improved but was not back to baseline. However, the emergency department left arm and leg weakness returned and patient was stroke alerted.  CT head was performed which showed no bleed.  CT angiogram was performed which showed no large vessel occlusion, patient does have right ICA stenosis 70%.  He is not a TPA candidate as he is outside the window.     LKW: 01/22/2018 at 2300 hrs. tpa given?: no, out of window Premorbid modified Rankin scale (mRS): 0 NIH stroke score: 6   ROS: A 14 point ROS was performed and is negative except as noted in the HPI.    Past Medical History:  Diagnosis Date  . Detached retina   . GERD (gastroesophageal reflux disease)   . Gout   . Hyperlipidemia    Myalgias with Lipitor  . Hypothyroidism   . Ischemic heart disease    Remote stent to LCX in 1997  . Melanoma ([place])   . Memory deficit   . Normal nuclear stress test June 2012  . Obese   . PAF (paroxysmal atrial fibrillation) (H[facility])     Family History  Problem Relation Age of Onset  . Heart disease Sister     Social History:   reports that he has  never smoked. He has never used smokeless tobacco. He reports that he does not drink alcohol or use drugs.  Medications  Current Facility-Administered Medications:  .  aspirin EC tablet 325 mg, 325 mg, Oral, Daily, Marliss Coots, PA-C .  clopidogrel (PLAVIX) tablet 300 mg, 300 mg, Oral, Once, Marliss Coots, PA-C .  [START ON 01/23/2018] clopidogrel (PLAVIX) tablet 75 mg, 75 mg, Oral, Daily, Marliss Coots, PA-C  Current Outpatient Medications:  .  allopurinol (ZYLOPRIM) 300 MG tablet, Take 300 mg by mouth daily.  , Disp: , Rfl:  .  amLODipine (NORVASC) 2.5 MG tablet, Take 1 tablet (2.5 mg total) by mouth daily., Disp: 90 tablet, Rfl: 2 .  aspirin 81 MG tablet, Take 81 mg by mouth daily.  , Disp: , Rfl:  .  donepezil (ARICEPT) 10 MG tablet, Take 1 tablet at bedtime (Patient taking differently: Take 10 mg by mouth at bedtime. ), Disp: 90 tablet, Rfl: 3 .  levothyroxine (SYNTHROID, LEVOTHROID) 112 MCG tablet, Take 112 mcg by mouth daily before breakfast. Take 1 tablet daily, Disp: , Rfl: 0 .  metFORMIN (GLUCOPHAGE) 850 MG tablet, Take 850 mg by mouth 2 (two) times daily with a meal., Disp: , Rfl:  .  nitroGLYCERIN (NITROSTAT) 0.4 MG SL tablet, Place 1 tablet (0.4 mg total) under the tongue every  5 (five) minutes as needed. (Patient taking differently: Place 0.4 mg under the tongue every 5 (five) minutes as needed for chest pain. ), Disp: 25 tablet, Rfl: 11 .  pyridOXINE (VITAMIN B-6) 100 MG tablet, Take 100 mg by mouth daily., Disp: , Rfl:  .  Saxagliptin-Metformin (KOMBIGLYZE XR) 09-998 MG TB24, Take 1 tablet by mouth daily., Disp: , Rfl:  .  simvastatin (ZOCOR) 20 MG tablet, Take 20 mg by mouth at bedtime.  , Disp: , Rfl:  .  vitamin B-12 (CYANOCOBALAMIN) 1000 MCG tablet, Take 1,000 mcg by mouth daily.  , Disp: , Rfl:  .  amLODipine (NORVASC) 2.5 MG tablet, TAKE 1 TABLET BY MOUTH ONCE DAILY (Patient not taking: Reported on 01/22/2018), Disp: 30 tablet, Rfl: 6   Exam: Current vital signs: BP  (!) 187/81   Pulse 77   Temp 98 F (36.7 C) (Oral)   Resp 16   SpO2 98%  Vital signs in last 24 hours: Temp:  [98 F (36.7 C)] 98 F (36.7 C) (09/16 1027) Pulse Rate:  [65-77] 77 (09/16 1100) Resp:  [16-20] 16 (09/16 1100) BP: (166-187)/(72-95) 187/81 (09/16 1100) SpO2:  [96 %-99 %] 98 % (09/16 1100)  GENERAL: Awake, alert in NAD HEENT: - Normocephalic and atraumatic,  Ext: warm, well perfused, intact peripheral pulses, __ edema  NEURO:  Mental Status: Alert, oriented to hospital, not oriented to age but able to name objects, repeat, follow commands with dysarthric voice,  Cranial Nerves: PERRL 2 mm/brisk. EOMI, visual fields full, left facial droop facial sensation intact, hearing intact, tongue/uvula/soft palate midline, Motor: Right arm and leg are 5/5.  Left arm left leg shows 4/5 strength.  Drift in both left arm and left leg. Tone: is normal and bulk is normal Sensation-decreased sensation on left arm however this is inconsistent at times Coordination: Finger-to-nose shows left dysmetria secondary to strength, heel-to-shin shows dysmetria secondary to strength  Labs I have reviewed labs in epic and the results pertinent to this consultation are:   CBC    Component Value Date/Time   WBC 9.3 01/22/2018 1049   RBC 3.98 (L) 01/22/2018 1049   HGB 13.9 01/22/2018 1055   HCT 41.0 01/22/2018 1055   PLT 177 01/22/2018 1049   MCV 104.0 (H) 01/22/2018 1049   MCH 34.4 (H) 01/22/2018 1049   MCHC 33.1 01/22/2018 1049   RDW 13.8 01/22/2018 1049   LYMPHSABS 1.4 01/22/2018 1049   MONOABS 0.5 01/22/2018 1049   EOSABS 0.1 01/22/2018 1049   BASOSABS 0.1 01/22/2018 1049    CMP     Component Value Date/Time   NA 139 01/22/2018 1055   K 3.8 01/22/2018 1055   CL 104 01/22/2018 1055   CO2 20 (L) 05/15/2016 1130   GLUCOSE 168 (H) 01/22/2018 1055   BUN 16 01/22/2018 1055   CREATININE 1.10 01/22/2018 1055   CALCIUM 9.6 05/15/2016 1130   PROT 5.3 (L) 02/16/2014 0447   ALBUMIN  2.1 (L) 02/16/2014 0447   AST 40 (H) 02/16/2014 0447   ALT 32 02/16/2014 0447   ALKPHOS 142 (H) 02/16/2014 0447   BILITOT 1.2 02/16/2014 0447   GFRNONAA 48 (L) 05/15/2016 1130   GFRAA 56 (L) 05/15/2016 1130    Lipid Panel  No results found for: CHOL, TRIG, HDL, CHOLHDL, VLDL, LDLCALC, LDLDIRECT   Imaging I have reviewed the images obtained:  CT-scan of the brain--no acute stroke or bleed  CTA of head and neck-showed no large vessel occlusion    Assessment: 84 year old  male with past medical history of paroxysmal atrial fibrillation presented to the hospital with facial droop, arm and leg weakness with fluctuating severity.  No cortical signs appreciated.  Patient not a candidate for TPA as he presented outside the window.   Recommendations:  # MRI of the brain without contrast #CTA head and neck-already performed.  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 #Transthoracic Echo, # Start patient with load of 300 mg p.o. Plavix.  Continue 325 mg p.o. aspirin and 75 mg Plavix daily.  However, given history of paroxysmal atrial fibrillation may need to be on anticoagulation. #Start or continue Atorvastatin 80 mg/other high intensity statin # BP goal: permissive HTN upto 220/120 mmHg # HBAIC and Lipid profile # Telemetry monitoring # Frequent neuro checks # NPO until passes stroke swallow screen # please page stroke NP  Or  PA  Or MD from 8am -4 pm  as this patient from this time will be  followed by the stroke.   You can look them up on [email or web address]  Password [email or web address]   NEUROHOSPITALIST ADDENDUM Performed a face to face diagnostic evaluation.   I have reviewed the contents of history and physical exam as documented by PA/ARNP/Resident and agree with above documentation.  I have discussed and formulated the above plan as documented. Edits to the note have been made as needed.  Patient presents with likely right MCA stroke versus  subcortical pontine/internal capsule infarct, TPA window.  NIH stroke scale is a 6 on assessment. Etiology of stroke needs to be determined-he can have multiple causes including paroxysmal atrial fibrillation, intracranial atherosclerosis as well as also uncontrolled risk factors resulting in a small vessel stroke. Plavix 300 mg load  And start on dual antiplatelet therapy.  However since he has paroxysmal atrial fibrillation, I suspect he will need to be started on anticoagulation. He also has a history of dementia which may complicate decision making following discharge.   Karena Addison Aroor MD [facility] [phone]   If 7pm to 7am, please call on call as listed on AMION.

## 2018-01-22 NOTE — Progress Notes (Signed)
  Echocardiogram 2D Echocardiogram has been performed.  Arthur Mcdaniel 01/22/2018, 3:25 PM

## 2018-01-22 NOTE — ED Triage Notes (Addendum)
Pt to ER via [facility] after neighbor activated EMS for patient having left facial droop and left hemiparesis. Upon EMS arrival leg weakness had resolved, but left facial droop and significant left arm weakness still present. On arrival to ER patient symptoms have resolved except left facial droop. Pt does reportedly suffer from mild dementia. Pt is alert to baseline, per neighbor. Hx of HTN, hypercholesterolemia, and DM.

## 2018-01-22 NOTE — ED Provider Notes (Signed)
[facility] EMERGENCY DEPARTMENT Provider Note   CSN: [id number] Arrival date & time: 01/22/18  1014     History   Chief Complaint Chief Complaint  Patient presents with  . Transient Ischemic Attack    HPI Arthur Mcdaniel is a 84 y.o. male.  Last known well was last night.  He has dementia level 5 caveat.  It sounds like he woke up around 8 or 830 this morning and slumped out of bed.  Family noted around 830 or 9 that he was having trouble using his left arm and left leg.  EMS was called and he refused transport because his symptoms had improved.  Symptoms had worsened again and EMS transported him here.  For EMS he had deficits left face left arm left leg.  During triage here in the ED he only had trace left facial droop but normal motor and sensory in his arm and leg.  His symptoms began acutely worsening and I was called to the bedside.  He denies history of these symptoms before.  No headache no chest pain no shortness of breath.  No trauma.  Not on any blood thinners.  Stroke was activated.  The history is provided by the patient, a relative and a significant other.  Cerebrovascular Accident  This is a new problem. The current episode started less than 1 hour ago. Pertinent negatives include no chest pain, no abdominal pain, no headaches and no shortness of breath. Nothing aggravates the symptoms. Nothing relieves the symptoms. He has tried nothing for the symptoms. The treatment provided no relief.    Past Medical History:  Diagnosis Date  . Detached retina   . GERD (gastroesophageal reflux disease)   . Gout   . Hyperlipidemia    Myalgias with Lipitor  . Hypothyroidism   . Ischemic heart disease    Remote stent to LCX in 1997  . Melanoma ([place])   . Memory deficit   . Normal nuclear stress test June 2012  . Obese   . PAF (paroxysmal atrial fibrillation) [facility])     Patient Active Problem List   Diagnosis Date Noted  . Mild dementia 07/29/2015  . Mild  cognitive impairment 01/15/2015  . Essential hypertension 01/15/2015  . Diabetes mellitus type 2, uncomplicated ([place]) 67/89/3810  . Diabetic polyneuropathy associated with diabetes mellitus due to underlying condition ([place]) 01/15/2015  . Cognitive changes 02/12/2014  . Choledocholithiasis 02/11/2014  . Choledocholithiasis with cholecystitis 02/11/2014  . CAD (coronary artery disease) 01/20/2011  . PAF (paroxysmal atrial fibrillation) ([place]) 01/20/2011  . HTN (hypertension) 01/20/2011  . Hyperlipidemia 01/20/2011    Past Surgical History:  Procedure Laterality Date  . CHOLECYSTECTOMY N/A 02/13/2014   Procedure: LAPAROSCOPIC CHOLECYSTECTOMY ;  Surgeon: Autumn Messing III, MD;  Location: [facility];  Service: General;  Laterality: N/A;  . CORONARY STENT PLACEMENT  1997   LCX        Home Medications    Prior to Admission medications   Medication Sig Start Date End Date Taking? Authorizing Provider  allopurinol (ZYLOPRIM) 300 MG tablet Take 300 mg by mouth daily.      [provider]  amLODipine (NORVASC) 2.5 MG tablet TAKE 1 TABLET BY MOUTH ONCE DAILY 11/16/17   [place], Peter M, MD  amLODipine (NORVASC) 2.5 MG tablet Take 1 tablet (2.5 mg total) by mouth daily. 11/16/17   [place], Peter M, MD  aspirin 81 MG tablet Take 81 mg by mouth daily.      [provider]  cycloSPORINE (RESTASIS) 0.05 % ophthalmic emulsion Place 1 drop into both eyes 2 (two) times daily.    [provider]  donepezil (ARICEPT) 10 MG tablet Take 1 tablet at bedtime 02/03/17   Cameron Sprang, MD  fluticasone [facility]) 50 MCG/ACT nasal spray Place 2 sprays into the nose as needed. Reported on 07/29/2015    [provider]  levothyroxine (SYNTHROID, LEVOTHROID) 100 MCG tablet 1 tablet. Take 1 tablet daily 07/20/15   [provider]  metFORMIN (GLUCOPHAGE) 850 MG tablet Take 850 mg by mouth 2 (two) times daily with a meal.    [provider]  nitroGLYCERIN (NITROSTAT) 0.4 MG SL  tablet Place 1 tablet (0.4 mg total) under the tongue every 5 (five) minutes as needed. 01/20/11   Burtis Junes, NP  pyridOXINE (VITAMIN B-6) 100 MG tablet Take 100 mg by mouth daily.    [provider]  simvastatin (ZOCOR) 20 MG tablet Take 20 mg by mouth at bedtime.      [provider]  vitamin B-12 (CYANOCOBALAMIN) 1000 MCG tablet Take 1,000 mcg by mouth daily.      [provider]    Family History Family History  Problem Relation Age of Onset  . Heart disease Sister     Social History Social History   Tobacco Use  . Smoking status: Never Smoker  . Smokeless tobacco: Never Used  Substance Use Topics  . Alcohol use: No    Alcohol/week: 0.0 standard drinks  . Drug use: No     Allergies   Naproxen   Review of Systems Review of Systems  Unable to perform ROS: Dementia  Constitutional: Negative for fever.  HENT: Negative for sore throat.   Respiratory: Negative for shortness of breath.   Cardiovascular: Negative for chest pain.  Gastrointestinal: Negative for abdominal pain.  Genitourinary: Negative for dysuria.  Skin: Negative for rash.  Neurological: Negative for headaches.     Physical Exam Updated Vital Signs BP (!) 166/72 (BP Location: Left Arm)   Pulse 65   Temp 98 F (36.7 C) (Oral)   Resp 20   SpO2 96%   Physical Exam  Constitutional: He appears well-developed and well-nourished.  HENT:  Head: Normocephalic and atraumatic.  Eyes: Conjunctivae are normal.  Neck: Neck supple.  Cardiovascular: Normal rate and regular rhythm.  No murmur heard. Pulmonary/Chest: Effort normal and breath sounds normal. No respiratory distress.  Abdominal: Soft. There is no tenderness.  Musculoskeletal: He exhibits no edema or deformity.  Neurological: He is alert.  Patient is alert not oriented.  Left facial droop and decreased sensation on the left side of his face.  Left arm strength I would rated as 4 out of 5 with also decreased  sensation.  Left leg strength 4 out of 5 and decreased sensation.  Gait testing deferred.  Skin: Skin is warm and dry.  Psychiatric: He has a normal mood and affect.  Nursing note and vitals reviewed.    ED Treatments / Results  Labs (all labs ordered are listed, but only abnormal results are displayed) Labs Reviewed  CBC - Abnormal; Notable for the following components:      Result Value   RBC 3.98 (*)    MCV 104.0 (*)    MCH 34.4 (*)    All other components within normal limits  COMPREHENSIVE METABOLIC PANEL - Abnormal; Notable for the following components:   CO2 21 (*)    Glucose, Bld 167 (*)  Creatinine, Ser 1.25 (*)    Total Bilirubin 1.3 (*)    GFR calc non Af Amer 51 (*)    GFR calc Af Amer 59 (*)    All other components within normal limits  URINALYSIS, ROUTINE W REFLEX MICROSCOPIC - Abnormal; Notable for the following components:   Hgb urine dipstick SMALL (*)    Ketones, ur 5 (*)    All other components within normal limits  HEMOGLOBIN A1C - Abnormal; Notable for the following components:   Hgb A1c MFr Bld 6.4 (*)    All other components within normal limits  TSH - Abnormal; Notable for the following components:   TSH 31.997 (*)    All other components within normal limits  GLUCOSE, CAPILLARY - Abnormal; Notable for the following components:   Glucose-Capillary 116 (*)    All other components within normal limits  GLUCOSE, CAPILLARY - Abnormal; Notable for the following components:   Glucose-Capillary 146 (*)    All other components within normal limits  GLUCOSE, CAPILLARY - Abnormal; Notable for the following components:   Glucose-Capillary 134 (*)    All other components within normal limits  GLUCOSE, CAPILLARY - Abnormal; Notable for the following components:   Glucose-Capillary 136 (*)    All other components within normal limits  GLUCOSE, CAPILLARY - Abnormal; Notable for the following components:   Glucose-Capillary 201 (*)    All other components  within normal limits  CBG MONITORING, ED - Abnormal; Notable for the following components:   Glucose-Capillary 158 (*)    All other components within normal limits  I-STAT CHEM 8, ED - Abnormal; Notable for the following components:   Glucose, Bld 168 (*)    All other components within normal limits  ETHANOL  PROTIME-INR  APTT  DIFFERENTIAL  RAPID URINE DRUG SCREEN, HOSP PERFORMED  LIPID PANEL  I-STAT TROPONIN, ED    EKG EKG Interpretation  Date/Time:  Monday January 22 2018 10:24:52 EDT Ventricular Rate:  65 PR Interval:    QRS Duration: 100 QT Interval:  443 QTC Calculation: 461 R Axis:   19 Text Interpretation:  Sinus rhythm RSR' in V1 or V2, probably normal variant Borderline T abnormalities, diffuse leads no significant change c/w 1/18 Confirmed by Aletta Edouard [phone]) on 01/22/2018 10:56:16 AM Also confirmed by Aletta Edouard [phone]), editor Philomena Doheny [phone])  on 01/22/2018 11:07:39 AM   Radiology Ct Angio Head W Or Wo Contrast  Result Date: 01/22/2018 CLINICAL DATA:  Stroke presentation. Acute onset of left facial droop and left-sided weakness. Negative head CT. EXAM: CT ANGIOGRAPHY HEAD AND NECK TECHNIQUE: Multidetector CT imaging of the head and neck was performed using the standard protocol during bolus administration of intravenous contrast. Multiplanar CT image reconstructions and MIPs were obtained to evaluate the vascular anatomy. Carotid stenosis measurements (when applicable) are obtained utilizing NASCET criteria, using the distal internal carotid diameter as the denominator. CONTRAST:  34mL ISOVUE-370 IOPAMIDOL (ISOVUE-370) INJECTION 76% COMPARISON:  None. FINDINGS: CTA NECK FINDINGS Aortic arch: There is atherosclerotic calcification of the arch. No aneurysm or dissection. Branching pattern of the brachiocephalic vessels is normal without origin stenosis. Right carotid system: Common carotid artery is tortuous but widely patent to the bifurcation region.  There is calcified plaque at the carotid bifurcation and ICA bulb. Minimal diameter at the proximal ICA is 3 mm. Compared to a more distal cervical ICA diameter of 5 mm, this indicates a 40% stenosis. Left carotid system: Common carotid artery shows some plaque but no stenosis. The  vessel is widely patent to the bifurcation region. There is calcified plaque at the proximal ICA and the bulb region. Minimal diameter is 4 mm. Compared to a more distal cervical ICA diameter of 5 mm, this indicates a 20% stenosis. Vertebral arteries: Both vertebral artery origins are patent, without calcified plaque, but with 50% stenoses just beyond the origins. Both vertebral arteries appear widely patent beyond that through the cervical region to the foramen magnum. Skeleton: Ordinary cervical spondylosis. Other neck: No mass or lymphadenopathy. Upper chest: Negative Review of the MIP images confirms the above findings CTA HEAD FINDINGS Anterior circulation: Both internal carotid arteries are patent through the skull base and siphon regions. There is atherosclerotic calcification in the carotid siphon regions but no stenosis greater than 50%. Supraclinoid stenosis of the ICA on the right is 70%. No a 1 segment is seen on the right. No flow limiting stenosis of the M1 or M2 segments. No missing vessel identified. On the left, there is supraclinoid stenosis of 50%. The left ICA supplies the left middle cerebral artery territory and both anterior cerebral artery territories. No flow limiting stenosis in those vessels or discernible branch vessel occlusion. Distal vessels show atherosclerotic irregularity. Posterior circulation: Both vertebral arteries are patent at the foramen magnum. There is atherosclerotic calcification at the dural penetration and in the V4 segments with no stenosis greater than 30% suspected. Both vertebral arteries give supply to the basilar. No basilar stenosis. Posterior circulation branch vessels appear patent  and normal in their proximal extent. Distal branch vessels show atherosclerotic irregularity. Venous sinuses: Patent and normal. Anatomic variants: None significant. Delayed phase: No abnormal enhancement. Review of the MIP images confirms the above findings IMPRESSION: No acute large or medium vessel occlusion identified. Atherosclerotic calcification at both carotid bifurcation and ICA regions. 40% stenosis of the proximal ICA on the right. 20% stenosis of the proximal ICA on the left. Atherosclerotic disease in the carotid siphon regions and supraclinoid internal carotid arteries. 70% stenosis of the supraclinoid ICA on the right. 50% stenosis of the supraclinoid ICA on the left. Distal vessel intracranial atherosclerotic irregularity of the anterior circulation branches. 50% stenosis at each vertebral artery origin. Plaque of the distal vertebral arteries but without stenosis greater than 30% suspected. No vertebral stenosis. Posterior circulation branch vessels show distal atherosclerotic irregularity. Electronically Signed   By: Nelson Chimes M.D.   On: 01/22/2018 12:01   Ct Angio Neck W Or Wo Contrast  Result Date: 01/22/2018 CLINICAL DATA:  Stroke presentation. Acute onset of left facial droop and left-sided weakness. Negative head CT. EXAM: CT ANGIOGRAPHY HEAD AND NECK TECHNIQUE: Multidetector CT imaging of the head and neck was performed using the standard protocol during bolus administration of intravenous contrast. Multiplanar CT image reconstructions and MIPs were obtained to evaluate the vascular anatomy. Carotid stenosis measurements (when applicable) are obtained utilizing NASCET criteria, using the distal internal carotid diameter as the denominator. CONTRAST:  90mL ISOVUE-370 IOPAMIDOL (ISOVUE-370) INJECTION 76% COMPARISON:  None. FINDINGS: CTA NECK FINDINGS Aortic arch: There is atherosclerotic calcification of the arch. No aneurysm or dissection. Branching pattern of the brachiocephalic  vessels is normal without origin stenosis. Right carotid system: Common carotid artery is tortuous but widely patent to the bifurcation region. There is calcified plaque at the carotid bifurcation and ICA bulb. Minimal diameter at the proximal ICA is 3 mm. Compared to a more distal cervical ICA diameter of 5 mm, this indicates a 40% stenosis. Left carotid system: Common carotid artery shows some plaque  but no stenosis. The vessel is widely patent to the bifurcation region. There is calcified plaque at the proximal ICA and the bulb region. Minimal diameter is 4 mm. Compared to a more distal cervical ICA diameter of 5 mm, this indicates a 20% stenosis. Vertebral arteries: Both vertebral artery origins are patent, without calcified plaque, but with 50% stenoses just beyond the origins. Both vertebral arteries appear widely patent beyond that through the cervical region to the foramen magnum. Skeleton: Ordinary cervical spondylosis. Other neck: No mass or lymphadenopathy. Upper chest: Negative Review of the MIP images confirms the above findings CTA HEAD FINDINGS Anterior circulation: Both internal carotid arteries are patent through the skull base and siphon regions. There is atherosclerotic calcification in the carotid siphon regions but no stenosis greater than 50%. Supraclinoid stenosis of the ICA on the right is 70%. No a 1 segment is seen on the right. No flow limiting stenosis of the M1 or M2 segments. No missing vessel identified. On the left, there is supraclinoid stenosis of 50%. The left ICA supplies the left middle cerebral artery territory and both anterior cerebral artery territories. No flow limiting stenosis in those vessels or discernible branch vessel occlusion. Distal vessels show atherosclerotic irregularity. Posterior circulation: Both vertebral arteries are patent at the foramen magnum. There is atherosclerotic calcification at the dural penetration and in the V4 segments with no stenosis greater  than 30% suspected. Both vertebral arteries give supply to the basilar. No basilar stenosis. Posterior circulation branch vessels appear patent and normal in their proximal extent. Distal branch vessels show atherosclerotic irregularity. Venous sinuses: Patent and normal. Anatomic variants: None significant. Delayed phase: No abnormal enhancement. Review of the MIP images confirms the above findings IMPRESSION: No acute large or medium vessel occlusion identified. Atherosclerotic calcification at both carotid bifurcation and ICA regions. 40% stenosis of the proximal ICA on the right. 20% stenosis of the proximal ICA on the left. Atherosclerotic disease in the carotid siphon regions and supraclinoid internal carotid arteries. 70% stenosis of the supraclinoid ICA on the right. 50% stenosis of the supraclinoid ICA on the left. Distal vessel intracranial atherosclerotic irregularity of the anterior circulation branches. 50% stenosis at each vertebral artery origin. Plaque of the distal vertebral arteries but without stenosis greater than 30% suspected. No vertebral stenosis. Posterior circulation branch vessels show distal atherosclerotic irregularity. Electronically Signed   By: Nelson Chimes M.D.   On: 01/22/2018 12:01   Mr Jodene Nam Head Wo Contrast  Result Date: 01/23/2018 CLINICAL DATA:  Initial evaluation for acute stroke, left-sided weakness. EXAM: MRI HEAD WITHOUT CONTRAST MRA HEAD WITHOUT CONTRAST TECHNIQUE: Multiplanar, multiecho pulse sequences of the brain and surrounding structures were obtained without intravenous contrast. Angiographic images of the head were obtained using MRA technique without contrast. COMPARISON:  Comparison made with prior CT and CTA from earlier the same day. FINDINGS: MRI HEAD FINDINGS Brain: Mild age-related cerebral atrophy. No significant cerebral white matter change for age. 13 mm focus of diffusion abnormality at the superior right internal capsule (series 5, image 58).  Associated T2/FLAIR signal abnormality without definite ADC correlate. Finding most consistent with an early subacute ischemic infarct. No associated hemorrhage or mass effect. No other evidence for acute or subacute ischemia. Gray-white matter differentiation otherwise maintained. No other areas of remote or chronic infarction. No acute or chronic intracranial hemorrhage. No mass lesion, midline shift or mass effect. No hydrocephalus. No extra-axial fluid collection. Normal pituitary gland. Vascular: Major intravascular flow voids maintained. Skull and upper cervical spine: Craniocervical  junction normal. No focal marrow replacing lesion. Scalp soft tissues unremarkable. Sinuses/Orbits: Globes and orbital soft tissues within normal limits. Mild scattered mucosal thickening throughout the paranasal sinuses. No air-fluid level to suggest acute sinusitis. Small bilateral mastoid effusions, of doubtful significance. Inner ear structures grossly normal. Other: None. MRA HEAD FINDINGS ANTERIOR CIRCULATION: Distal cervical segments of the internal carotid arteries are patent with antegrade flow. Petrous segments widely patent bilaterally. Scattered atheromatous irregularity within the cavernous/supraclinoid ICAs, right greater than left. Approximate 70% stenosis at the supraclinoid right ICA and 50% stenosis at the left, stable from previous. Left A1 widely patent. Right A1 hypoplastic and/or absent. Normal anterior communicating artery. Anterior cerebral arteries patent to their distal aspects without stenosis. M1 segments widely patent without stenosis. Short-segment severe right M2 stenosis, anterior temporal branch, stable from previous (series 7, image 122). Distal MCA branches well perfused and symmetric. Distal small vessel atheromatous irregularity. POSTERIOR CIRCULATION: Vertebral arteries patent to the vertebrobasilar junction without flow-limiting stenosis. Left vertebral artery slightly dominant. Right PICA  partially visualized and widely patent. Left PICA not seen. Basilar widely patent to its distal aspect. Superior cerebral arteries patent bilaterally. Both of the posterior cerebral artery supplied via the basilar and are well perfused to their distal aspects. Short-segment moderate left P2 stenosis (series 1032, image 12). PCAs otherwise widely patent to their distal aspects. No intracranial aneurysm. IMPRESSION: MRI HEAD IMPRESSION: 1. 13 mm early subacute nonhemorrhagic ischemic infarct involving the superior right internal capsule. 2. Otherwise unremarkable brain MRI for age. MRA HEAD IMPRESSION: 1. Negative intracranial MRA for large vessel occlusion. 2. Moderate atheromatous irregularity with and stenoses involving the supraclinoid ICAs bilaterally, right greater than left. 3. Additional atherosclerotic change elsewhere within the intracranial circulation as above. No other proximal high-grade or correctable stenosis identified. Electronically Signed   By: Jeannine Boga M.D.   On: 01/23/2018 00:19   Mr Brain Wo Contrast  Result Date: 01/23/2018 CLINICAL DATA:  Initial evaluation for acute stroke, left-sided weakness. EXAM: MRI HEAD WITHOUT CONTRAST MRA HEAD WITHOUT CONTRAST TECHNIQUE: Multiplanar, multiecho pulse sequences of the brain and surrounding structures were obtained without intravenous contrast. Angiographic images of the head were obtained using MRA technique without contrast. COMPARISON:  Comparison made with prior CT and CTA from earlier the same day. FINDINGS: MRI HEAD FINDINGS Brain: Mild age-related cerebral atrophy. No significant cerebral white matter change for age. 13 mm focus of diffusion abnormality at the superior right internal capsule (series 5, image 58). Associated T2/FLAIR signal abnormality without definite ADC correlate. Finding most consistent with an early subacute ischemic infarct. No associated hemorrhage or mass effect. No other evidence for acute or subacute  ischemia. Gray-white matter differentiation otherwise maintained. No other areas of remote or chronic infarction. No acute or chronic intracranial hemorrhage. No mass lesion, midline shift or mass effect. No hydrocephalus. No extra-axial fluid collection. Normal pituitary gland. Vascular: Major intravascular flow voids maintained. Skull and upper cervical spine: Craniocervical junction normal. No focal marrow replacing lesion. Scalp soft tissues unremarkable. Sinuses/Orbits: Globes and orbital soft tissues within normal limits. Mild scattered mucosal thickening throughout the paranasal sinuses. No air-fluid level to suggest acute sinusitis. Small bilateral mastoid effusions, of doubtful significance. Inner ear structures grossly normal. Other: None. MRA HEAD FINDINGS ANTERIOR CIRCULATION: Distal cervical segments of the internal carotid arteries are patent with antegrade flow. Petrous segments widely patent bilaterally. Scattered atheromatous irregularity within the cavernous/supraclinoid ICAs, right greater than left. Approximate 70% stenosis at the supraclinoid right ICA and 50% stenosis at the  left, stable from previous. Left A1 widely patent. Right A1 hypoplastic and/or absent. Normal anterior communicating artery. Anterior cerebral arteries patent to their distal aspects without stenosis. M1 segments widely patent without stenosis. Short-segment severe right M2 stenosis, anterior temporal branch, stable from previous (series 7, image 122). Distal MCA branches well perfused and symmetric. Distal small vessel atheromatous irregularity. POSTERIOR CIRCULATION: Vertebral arteries patent to the vertebrobasilar junction without flow-limiting stenosis. Left vertebral artery slightly dominant. Right PICA partially visualized and widely patent. Left PICA not seen. Basilar widely patent to its distal aspect. Superior cerebral arteries patent bilaterally. Both of the posterior cerebral artery supplied via the basilar and  are well perfused to their distal aspects. Short-segment moderate left P2 stenosis (series 1032, image 12). PCAs otherwise widely patent to their distal aspects. No intracranial aneurysm. IMPRESSION: MRI HEAD IMPRESSION: 1. 13 mm early subacute nonhemorrhagic ischemic infarct involving the superior right internal capsule. 2. Otherwise unremarkable brain MRI for age. MRA HEAD IMPRESSION: 1. Negative intracranial MRA for large vessel occlusion. 2. Moderate atheromatous irregularity with and stenoses involving the supraclinoid ICAs bilaterally, right greater than left. 3. Additional atherosclerotic change elsewhere within the intracranial circulation as above. No other proximal high-grade or correctable stenosis identified. Electronically Signed   By: Jeannine Boga M.D.   On: 01/23/2018 00:19   Ct Head Code Stroke Wo Contrast  Result Date: 01/22/2018 CLINICAL DATA:  Code stroke. Acute onset of left-sided facial droop and left-sided weakness. EXAM: CT HEAD WITHOUT CONTRAST TECHNIQUE: Contiguous axial images were obtained from the base of the skull through the vertex without intravenous contrast. COMPARISON:  None. FINDINGS: Brain: No acute infarct, hemorrhage, or mass lesion is present. Basal ganglia are intact. Insular ribbon is normal. No acute or focal cortical abnormality is present. Mild white matter changes are present. The brainstem and cerebellum are normal. Vascular: Atherosclerotic calcifications are present in the cavernous internal carotid arteries bilaterally. No asymmetric hyperdense vessel is present. Skull: Calvarium is intact. No focal lytic or blastic lesions are present. Sinuses/Orbits: The paranasal sinuses and mastoid air cells are clear. Globes and orbits are within normal limits. ASPECTS [place] [facility] Stroke Program Early CT Score) - Ganglionic level infarction (caudate, lentiform nuclei, internal capsule, insula, M1-M3 cortex): 7/7 - Supraganglionic infarction (M4-M6 cortex): 3/3 Total  score (0-10 with 10 being normal): 10/10 IMPRESSION: 1. No acute intracranial abnormality. 2. Mild atrophy and white matter changes are within normal limits for age. 3. Atherosclerosis 4. ASPECTS is 10/10 The above was relayed via text pager to Dr. Lorraine Lax on 01/22/2018 at 10:59 . Electronically Signed   By: San Morelle M.D.   On: 01/22/2018 11:00    Procedures .Critical Care Performed by: Hayden Rasmussen, MD Authorized by: Hayden Rasmussen, MD   Critical care provider statement:    Critical care time (minutes):  45   Critical care was necessary to treat or prevent imminent or life-threatening deterioration of the following conditions:  CNS failure or compromise   Critical care was time spent personally by me on the following activities:  Discussions with consultants, evaluation of patient's response to treatment, examination of patient, ordering and performing treatments and interventions, ordering and review of laboratory studies, ordering and review of radiographic studies, pulse oximetry, re-evaluation of patient's condition, obtaining history from patient or surrogate, review of old charts and development of treatment plan with patient or surrogate   I assumed direction of critical care for this patient from another provider in my specialty: no     (including  critical care time)  Medications Ordered in ED Medications - No data to display   Initial Impression / Assessment and Plan / ED Course  I have reviewed the triage vital signs and the nursing notes.  Pertinent labs & imaging results that were available during my care of the patient were reviewed by me and considered in my medical decision making (see chart for details).  Clinical Course as of Jan 23 817  Mon Jan 22, 2018  1055 I activated a code stroke due to the patient's waxing and waning symptoms.  I am not sure that he ever completely resolved before he worsened again but will let neurology see if they declare him in  the window.   [MB]  1140 Dr. Lorraine Lax from neurology is recommending no TPA.  He feels the patient will need to be admitted for continued stroke work-up including MRI.  I have paged the hospitalist for admission.  Family updated.   [MB]  1156 Discussed with Dr. Lorin Mercy from the hospitalist service who will evaluate the patient for admission.   [MB]    Clinical Course User Index [MB] Hayden Rasmussen, MD     Final Clinical Impressions(s) / ED Diagnoses   Final diagnoses:  Cerebrovascular accident (CVA), unspecified mechanism [facility])    ED Discharge Orders    None       Hayden Rasmussen, MD 01/23/18 [phone]

## 2018-01-22 NOTE — H&P (Signed)
History and Physical    MACEDONIO SCALLON VQQ:[id number] DOB: July 03, 1930 DOA: 01/22/2018  PCP: Deland Pretty, MD Consultants:  None Patient coming from:  Home - lives with wife; NOK: Wife, [phone]; daughter; Maia Breslow, Meta  Chief Complaint: Stroke-like symptoms  HPI: Arthur Mcdaniel is a 84 y.o. male with medical history significant of afib; dementia; CAD s/p stent; hypothyroidism; and HLD presenting with stroke-like symptoms.  "I'm not sure I can."  The nighbor was called - he got up this morning and was unable to get dressed.  Whne he arrived, he was still trying to dress himself.  He came down the steps fairly well, but at the bottom he had to sit down because he was having trouble standing.  When he tried to stand up, his left leg didn't want to work.  Then his left arm went limp.  EMS arrived and he refused transport.  20 minutes later, the family convinced him to come in.  They called EMS again and transported him.  Left-sided facial droop noticed by the neighbor.  No dysarthria or aphasia.  No dysphagia.  Confusion is somewhat worse although is chronic.  Physically he reports feeling fine, mentally "I don't know what's going on."  He has had a TIA in the past but no stroke.  His neighbor came outside after the evaluation and reported that he has significant concerns about both the patient and his wife.  It appears that the patient has mild-moderate dementia and his wife has moderate dementia and he has concerns about their ongoing ability to function independently.  He would like ongoing discussions with the patient's daughter (arriving today from [facility]) and son (who lives in [place]) to determine whether placement is needed.  ED Course:  Slumped out of bed, couldn't stand.  Left arm, leg, face symptoms, waxing and waning.  Code stroke called - likely not a thrombolysis candidate.  Neurology consulted.  MRI pending.  Review of Systems: As per HPI; otherwise review of systems  reviewed and negative.   Ambulatory Status:  Ambulates without assistance  Past Medical History:  Diagnosis Date  . Detached retina   . GERD (gastroesophageal reflux disease)   . Gout   . Hyperlipidemia    Myalgias with Lipitor  . Hypothyroidism   . Ischemic heart disease    Remote stent to LCX in 1997  . Melanoma ([place])   . Memory deficit   . Normal nuclear stress test June 2012  . Obese   . PAF (paroxysmal atrial fibrillation) ([place])     Past Surgical History:  Procedure Laterality Date  . CHOLECYSTECTOMY N/A 02/13/2014   Procedure: LAPAROSCOPIC CHOLECYSTECTOMY ;  Surgeon: Autumn Messing III, MD;  Location: [place];  Service: General;  Laterality: N/A;  . CORONARY STENT PLACEMENT  1997   LCX    Social History   Socioeconomic History  . Marital status: Married    Spouse name: [name]  . Number of children: 2  . Years of education: 46  . Highest education level: Not on file  Occupational History  . Occupation: Retired  Scientific laboratory technician  . Financial resource strain: Not on file  . Food insecurity:    Worry: Not on file    Inability: Not on file  . Transportation needs:    Medical: Not on file    Non-medical: Not on file  Tobacco Use  . Smoking status: Never Smoker  . Smokeless tobacco: Never Used  Substance and Sexual Activity  . Alcohol  use: No    Alcohol/week: 0.0 standard drinks  . Drug use: No  . Sexual activity: Not on file  Lifestyle  . Physical activity:    Days per week: Not on file    Minutes per session: Not on file  . Stress: Not on file  Relationships  . Social connections:    Talks on phone: Not on file    Gets together: Not on file    Attends religious service: Not on file    Active member of club or organization: Not on file    Attends meetings of clubs or organizations: Not on file    Relationship status: Not on file  . Intimate partner violence:    Fear of current or ex partner: Not on file    Emotionally abused: Not on file    Physically  abused: Not on file    Forced sexual activity: Not on file  Other Topics Concern  . Not on file  Social History Narrative   Patient is right handed and resides with wife in a home    Allergies  Allergen Reactions  . Naproxen Other (See Comments)    Doesn't remember     Family History  Problem Relation Age of Onset  . Heart disease Sister     Prior to Admission medications   Medication Sig Start Date End Date Taking? Authorizing Provider  allopurinol (ZYLOPRIM) 300 MG tablet Take 300 mg by mouth daily.     Yes [provider]  amLODipine (NORVASC) 2.5 MG tablet Take 1 tablet (2.5 mg total) by mouth daily. 11/16/17  Yes Martinique, Peter M, MD  aspirin 81 MG tablet Take 81 mg by mouth daily.     Yes [provider]  donepezil (ARICEPT) 10 MG tablet Take 1 tablet at bedtime Patient taking differently: Take 10 mg by mouth at bedtime.  02/03/17  Yes Cameron Sprang, MD  levothyroxine (SYNTHROID, LEVOTHROID) 112 MCG tablet Take 112 mcg by mouth daily before breakfast. Take 1 tablet daily 07/20/15  Yes [provider]  metFORMIN (GLUCOPHAGE) 850 MG tablet Take 850 mg by mouth 2 (two) times daily with a meal.   Yes [provider]  nitroGLYCERIN (NITROSTAT) 0.4 MG SL tablet Place 1 tablet (0.4 mg total) under the tongue every 5 (five) minutes as needed. Patient taking differently: Place 0.4 mg under the tongue every 5 (five) minutes as needed for chest pain.  01/20/11  Yes Burtis Junes, NP  pyridOXINE (VITAMIN B-6) 100 MG tablet Take 100 mg by mouth daily.   Yes [provider]  Saxagliptin-Metformin (KOMBIGLYZE XR) 09-998 MG TB24 Take 1 tablet by mouth daily.   Yes [provider]  simvastatin (ZOCOR) 20 MG tablet Take 20 mg by mouth at bedtime.     Yes [provider]  vitamin B-12 (CYANOCOBALAMIN) 1000 MCG tablet Take 1,000 mcg by mouth daily.     Yes [provider]  amLODipine (NORVASC) 2.5 MG tablet TAKE 1 TABLET BY  MOUTH ONCE DAILY Patient not taking: Reported on 01/22/2018 11/16/17   Martinique, Peter M, MD    Physical Exam: Vitals:   01/22/18 1027 01/22/18 1030 01/22/18 1100  BP: (!) 166/72 (!) 183/95 (!) 187/81  Pulse: 65 67 77  Resp: 20 16 16   Temp: 98 F (36.7 C)    TempSrc: Oral    SpO2: 96% 99% 98%     General:  Appears calm and comfortable and is NAD; he is very pleasant but  easily confused Eyes:  PERRL, EOMI, normal lids, iris ENT:  grossly normal hearing, lips & tongue, mmm; appropriate dentition Neck:  no LAD, masses or thyromegaly; no carotid bruits Cardiovascular:  RRR, no m/r/g. No LE edema.  Respiratory:   CTA bilaterally with no wheezes/rales/rhonchi.  Normal respiratory effort. Abdomen:  soft, NT, ND, NABS Back:   normal alignment, no CVAT Skin:  no rash or induration seen on limited exam Musculoskeletal:  grossly normal tone BUE/BLE, good ROM, no bony abnormality Psychiatric:  grossly normal mood and affect, speech fluent and appropriate, AOx2; clearly has confusion c/w mild to moderate dementia Neurologic:  CN 2-12 grossly intact other than mild left facial droop in the V3 distribution, moves all extremities in coordinated fashion, sensation intact    Radiological Exams on Admission: Ct Angio Head W Or Wo Contrast  Result Date: 01/22/2018 CLINICAL DATA:  Stroke presentation. Acute onset of left facial droop and left-sided weakness. Negative head CT. EXAM: CT ANGIOGRAPHY HEAD AND NECK TECHNIQUE: Multidetector CT imaging of the head and neck was performed using the standard protocol during bolus administration of intravenous contrast. Multiplanar CT image reconstructions and MIPs were obtained to evaluate the vascular anatomy. Carotid stenosis measurements (when applicable) are obtained utilizing NASCET criteria, using the distal internal carotid diameter as the denominator. CONTRAST:  57mL ISOVUE-370 IOPAMIDOL (ISOVUE-370) INJECTION 76% COMPARISON:  None. FINDINGS: CTA NECK  FINDINGS Aortic arch: There is atherosclerotic calcification of the arch. No aneurysm or dissection. Branching pattern of the brachiocephalic vessels is normal without origin stenosis. Right carotid system: Common carotid artery is tortuous but widely patent to the bifurcation region. There is calcified plaque at the carotid bifurcation and ICA bulb. Minimal diameter at the proximal ICA is 3 mm. Compared to a more distal cervical ICA diameter of 5 mm, this indicates a 40% stenosis. Left carotid system: Common carotid artery shows some plaque but no stenosis. The vessel is widely patent to the bifurcation region. There is calcified plaque at the proximal ICA and the bulb region. Minimal diameter is 4 mm. Compared to a more distal cervical ICA diameter of 5 mm, this indicates a 20% stenosis. Vertebral arteries: Both vertebral artery origins are patent, without calcified plaque, but with 50% stenoses just beyond the origins. Both vertebral arteries appear widely patent beyond that through the cervical region to the foramen magnum. Skeleton: Ordinary cervical spondylosis. Other neck: No mass or lymphadenopathy. Upper chest: Negative Review of the MIP images confirms the above findings CTA HEAD FINDINGS Anterior circulation: Both internal carotid arteries are patent through the skull base and siphon regions. There is atherosclerotic calcification in the carotid siphon regions but no stenosis greater than 50%. Supraclinoid stenosis of the ICA on the right is 70%. No a 1 segment is seen on the right. No flow limiting stenosis of the M1 or M2 segments. No missing vessel identified. On the left, there is supraclinoid stenosis of 50%. The left ICA supplies the left middle cerebral artery territory and both anterior cerebral artery territories. No flow limiting stenosis in those vessels or discernible branch vessel occlusion. Distal vessels show atherosclerotic irregularity. Posterior circulation: Both vertebral arteries are  patent at the foramen magnum. There is atherosclerotic calcification at the dural penetration and in the V4 segments with no stenosis greater than 30% suspected. Both vertebral arteries give supply to the basilar. No basilar stenosis. Posterior circulation branch vessels appear patent and normal in their proximal extent. Distal branch vessels show atherosclerotic irregularity. Venous sinuses: Patent and normal. Anatomic  variants: None significant. Delayed phase: No abnormal enhancement. Review of the MIP images confirms the above findings IMPRESSION: No acute large or medium vessel occlusion identified. Atherosclerotic calcification at both carotid bifurcation and ICA regions. 40% stenosis of the proximal ICA on the right. 20% stenosis of the proximal ICA on the left. Atherosclerotic disease in the carotid siphon regions and supraclinoid internal carotid arteries. 70% stenosis of the supraclinoid ICA on the right. 50% stenosis of the supraclinoid ICA on the left. Distal vessel intracranial atherosclerotic irregularity of the anterior circulation branches. 50% stenosis at each vertebral artery origin. Plaque of the distal vertebral arteries but without stenosis greater than 30% suspected. No vertebral stenosis. Posterior circulation branch vessels show distal atherosclerotic irregularity. Electronically Signed   By: Nelson Chimes M.D.   On: 01/22/2018 12:01   Ct Angio Neck W Or Wo Contrast  Result Date: 01/22/2018 CLINICAL DATA:  Stroke presentation. Acute onset of left facial droop and left-sided weakness. Negative head CT. EXAM: CT ANGIOGRAPHY HEAD AND NECK TECHNIQUE: Multidetector CT imaging of the head and neck was performed using the standard protocol during bolus administration of intravenous contrast. Multiplanar CT image reconstructions and MIPs were obtained to evaluate the vascular anatomy. Carotid stenosis measurements (when applicable) are obtained utilizing NASCET criteria, using the distal internal  carotid diameter as the denominator. CONTRAST:  52mL ISOVUE-370 IOPAMIDOL (ISOVUE-370) INJECTION 76% COMPARISON:  None. FINDINGS: CTA NECK FINDINGS Aortic arch: There is atherosclerotic calcification of the arch. No aneurysm or dissection. Branching pattern of the brachiocephalic vessels is normal without origin stenosis. Right carotid system: Common carotid artery is tortuous but widely patent to the bifurcation region. There is calcified plaque at the carotid bifurcation and ICA bulb. Minimal diameter at the proximal ICA is 3 mm. Compared to a more distal cervical ICA diameter of 5 mm, this indicates a 40% stenosis. Left carotid system: Common carotid artery shows some plaque but no stenosis. The vessel is widely patent to the bifurcation region. There is calcified plaque at the proximal ICA and the bulb region. Minimal diameter is 4 mm. Compared to a more distal cervical ICA diameter of 5 mm, this indicates a 20% stenosis. Vertebral arteries: Both vertebral artery origins are patent, without calcified plaque, but with 50% stenoses just beyond the origins. Both vertebral arteries appear widely patent beyond that through the cervical region to the foramen magnum. Skeleton: Ordinary cervical spondylosis. Other neck: No mass or lymphadenopathy. Upper chest: Negative Review of the MIP images confirms the above findings CTA HEAD FINDINGS Anterior circulation: Both internal carotid arteries are patent through the skull base and siphon regions. There is atherosclerotic calcification in the carotid siphon regions but no stenosis greater than 50%. Supraclinoid stenosis of the ICA on the right is 70%. No a 1 segment is seen on the right. No flow limiting stenosis of the M1 or M2 segments. No missing vessel identified. On the left, there is supraclinoid stenosis of 50%. The left ICA supplies the left middle cerebral artery territory and both anterior cerebral artery territories. No flow limiting stenosis in those vessels or  discernible branch vessel occlusion. Distal vessels show atherosclerotic irregularity. Posterior circulation: Both vertebral arteries are patent at the foramen magnum. There is atherosclerotic calcification at the dural penetration and in the V4 segments with no stenosis greater than 30% suspected. Both vertebral arteries give supply to the basilar. No basilar stenosis. Posterior circulation branch vessels appear patent and normal in their proximal extent. Distal branch vessels show atherosclerotic irregularity. Venous sinuses:  Patent and normal. Anatomic variants: None significant. Delayed phase: No abnormal enhancement. Review of the MIP images confirms the above findings IMPRESSION: No acute large or medium vessel occlusion identified. Atherosclerotic calcification at both carotid bifurcation and ICA regions. 40% stenosis of the proximal ICA on the right. 20% stenosis of the proximal ICA on the left. Atherosclerotic disease in the carotid siphon regions and supraclinoid internal carotid arteries. 70% stenosis of the supraclinoid ICA on the right. 50% stenosis of the supraclinoid ICA on the left. Distal vessel intracranial atherosclerotic irregularity of the anterior circulation branches. 50% stenosis at each vertebral artery origin. Plaque of the distal vertebral arteries but without stenosis greater than 30% suspected. No vertebral stenosis. Posterior circulation branch vessels show distal atherosclerotic irregularity. Electronically Signed   By: Nelson Chimes M.D.   On: 01/22/2018 12:01   Ct Head Code Stroke Wo Contrast  Result Date: 01/22/2018 CLINICAL DATA:  Code stroke. Acute onset of left-sided facial droop and left-sided weakness. EXAM: CT HEAD WITHOUT CONTRAST TECHNIQUE: Contiguous axial images were obtained from the base of the skull through the vertex without intravenous contrast. COMPARISON:  None. FINDINGS: Brain: No acute infarct, hemorrhage, or mass lesion is present. Basal ganglia are intact.  Insular ribbon is normal. No acute or focal cortical abnormality is present. Mild white matter changes are present. The brainstem and cerebellum are normal. Vascular: Atherosclerotic calcifications are present in the cavernous internal carotid arteries bilaterally. No asymmetric hyperdense vessel is present. Skull: Calvarium is intact. No focal lytic or blastic lesions are present. Sinuses/Orbits: The paranasal sinuses and mastoid air cells are clear. Globes and orbits are within normal limits. ASPECTS [facility] Stroke Program Early CT Score) - Ganglionic level infarction (caudate, lentiform nuclei, internal capsule, insula, M1-M3 cortex): 7/7 - Supraganglionic infarction (M4-M6 cortex): 3/3 Total score (0-10 with 10 being normal): 10/10 IMPRESSION: 1. No acute intracranial abnormality. 2. Mild atrophy and white matter changes are within normal limits for age. 3. Atherosclerosis 4. ASPECTS is 10/10 The above was relayed via text pager to Dr. Lorraine Lax on 01/22/2018 at 10:59 . Electronically Signed   By: San Morelle M.D.   On: 01/22/2018 11:00    EKG: Independently reviewed.  NSR with rate 65; nonspecific ST changes with no evidence of acute ischemia   Labs on Admission: I have personally reviewed the available labs and imaging studies at the time of the admission.  Pertinent labs:   Glucose 168 Troponin 0.00 CBC unremarkable INR 1.11   Assessment/Plan Principal Problem:   TIA (transient ischemic attack) Active Problems:   PAF (paroxysmal atrial fibrillation) (HCC)   Hyperlipidemia   Essential hypertension   Diabetes mellitus type 2, uncomplicated (HCC)   Mild dementia   TIA -Patient presented with episodic left body weakness and persistent left facial droop -Concerning for TIA/CVA -Will place in observation status for CVA/TIA evaluation -Telemetry monitoring -MRI/MRA -Echo -Risk stratification with FLP, A1c; will also check TSH  -ASA daily (DAPT for a limited period of  time) -Neurology consult -PT/OT/ST/Nutrition Consults  HTN -Allow permissive HTN for now -Treat BP only if >220/120, and then with goal of 15% reduction -Hold Norvasc and plan to restart in 48-72 hours   HLD -Check FLP -Resume statin but will change Zocor to Lipitor 40 mg daily   DM -Check A1c -Hold home PO medications (Saxagliptin, Metformin) -Will order moderate-scale SSI  Afib -Remote history in the setting of pleurisy -He is not on [facility] -Will need to consider whether to resume - particularly in light  of his dementia.  Dementia -Neighbor has concerns about the patient and his wife and their ability to live independently -Will need ongoing family discussions when their daughter arrives in town -Continue Aricept     DVT prophylaxis:  Lovenox  Code Status: Full - confirmed with patient/family Family Communication: Neighbor present throughout evaluation Disposition Plan:  Home once clinically improved Consults called: Neurology; PT/OT/ST/SW/Nutrition  Admission status: It is my clinical opinion that referral for OBSERVATION is reasonable and necessary in this patient based on the above information provided. The aforementioned taken together are felt to place the patient at high risk for further clinical deterioration. However it is anticipated that the patient may be medically stable for discharge from the hospital within 24 to 48 hours.    Karmen Bongo MD [facility]  If note is complete, please contact covering daytime or nighttime physician. [email or web address] Password [email or web address]  01/22/2018, 1:03 PM

## 2018-01-22 NOTE — Progress Notes (Signed)
Pt disoriented to time and place 

## 2018-01-22 NOTE — Plan of Care (Signed)
  Problem: Education: Goal: Knowledge of General Education information will improve Description: Including pain rating scale, medication(s)/side effects and non-pharmacologic comfort measures Outcome: Progressing   Problem: Health Behavior/Discharge Planning: Goal: Ability to manage health-related needs will improve Outcome: Progressing   Problem: Clinical Measurements: Goal: Ability to maintain clinical measurements within normal limits will improve Outcome: Progressing Goal: Will remain free from infection Outcome: Progressing Goal: Diagnostic test results will improve Outcome: Progressing Goal: Respiratory complications will improve Outcome: Progressing Goal: Cardiovascular complication will be avoided Outcome: Progressing   Problem: Activity: Goal: Risk for activity intolerance will decrease Outcome: Progressing   Problem: Nutrition: Goal: Adequate nutrition will be maintained Outcome: Progressing   Problem: Coping: Goal: Level of anxiety will decrease Outcome: Progressing   Problem: Elimination: Goal: Will not experience complications related to bowel motility Outcome: Progressing Goal: Will not experience complications related to urinary retention Outcome: Progressing   Problem: Pain Managment: Goal: General experience of comfort will improve Outcome: Progressing   Problem: Safety: Goal: Ability to remain free from injury will improve Outcome: Progressing   Problem: Skin Integrity: Goal: Risk for impaired skin integrity will decrease Outcome: Progressing   Problem: Education: Goal: Knowledge of disease or condition will improve Outcome: Progressing Goal: Knowledge of secondary prevention will improve Outcome: Progressing Goal: Knowledge of patient specific risk factors addressed and post discharge goals established will improve Outcome: Progressing   Problem: Coping: Goal: Will verbalize positive feelings about self Outcome: Progressing Goal: Will  identify appropriate support needs Outcome: Progressing   Problem: Health Behavior/Discharge Planning: Goal: Ability to manage health-related needs will improve Outcome: Progressing   Problem: Self-Care: Goal: Ability to participate in self-care as condition permits will improve Outcome: Progressing Goal: Verbalization of feelings and concerns over difficulty with self-care will improve Outcome: Progressing Goal: Ability to communicate needs accurately will improve Outcome: Progressing   Problem: Nutrition: Goal: Risk of aspiration will decrease Outcome: Progressing Goal: Dietary intake will improve Outcome: Progressing   Problem: Ischemic Stroke/TIA Tissue Perfusion: Goal: Complications of ischemic stroke/TIA will be minimized Outcome: Progressing   

## 2018-01-23 DIAGNOSIS — I639 Cerebral infarction, unspecified: Secondary | ICD-10-CM

## 2018-01-23 DIAGNOSIS — E785 Hyperlipidemia, unspecified: Secondary | ICD-10-CM

## 2018-01-23 DIAGNOSIS — E119 Type 2 diabetes mellitus without complications: Secondary | ICD-10-CM | POA: Diagnosis not present

## 2018-01-23 DIAGNOSIS — G459 Transient cerebral ischemic attack, unspecified: Secondary | ICD-10-CM | POA: Diagnosis not present

## 2018-01-23 LAB — LIPID PANEL
Cholesterol: 136 mg/dL (ref 0–200)
HDL: 56 mg/dL (ref >40)
LDL CALC: 57 mg/dL (ref 0–99)
TRIGLYCERIDES: 114 mg/dL (ref <150)
Total CHOL/HDL Ratio: 2.4 RATIO
VLDL: 23 mg/dL (ref 0–40)

## 2018-01-23 LAB — GLUCOSE, CAPILLARY
GLUCOSE-CAPILLARY: 136 mg/dL — AB (ref 70–99)
Glucose-Capillary: 201 mg/dL — ABNORMAL HIGH (ref 70–99)
Glucose-Capillary: 159 mg/dL — ABNORMAL HIGH (ref 70–99)

## 2018-01-23 LAB — HEMOGLOBIN A1C
HEMOGLOBIN A1C: 6.4 % — AB (ref 4.8–5.6)
MEAN PLASMA GLUCOSE: 137 mg/dL

## 2018-01-23 MED ORDER — ASPIRIN EC 81 MG PO TBEC
81.0000 mg | DELAYED_RELEASE_TABLET | Freq: Every day | ORAL | Status: DC
  Administered 2018-01-23: 81 mg via ORAL
  Filled 2018-01-23: qty 1

## 2018-01-23 MED ORDER — SIMVASTATIN 20 MG PO TABS: 20.0000 mg | ORAL_TABLET | Freq: Every day | ORAL | Status: DC

## 2018-01-23 MED ORDER — CLOPIDOGREL BISULFATE 75 MG PO TABS: 75.0000 mg | ORAL_TABLET | Freq: Every day | ORAL | 0 refills | Status: DC

## 2018-01-23 NOTE — Discharge Summary (Signed)
Physician Discharge Summary  Arthur Mcdaniel UMP:[id number] DOB: [birth year] DOA: 01/22/2018  PCP: Deland Pretty, MD  Admit date: 01/22/2018 Discharge date: 01/23/2018  Admitted From: home Discharge disposition: home   Recommendations for Outpatient Follow-Up:   1. ASA/plavix x 3 weeks then plavix alone 2. Home health and 24/7 supervision by family   Discharge Diagnosis:   Principal Problem: CVA Active Problems:   PAF (paroxysmal atrial fibrillation) (Arthur Mcdaniel)   Hyperlipidemia   Essential hypertension   Diabetes mellitus type 2, uncomplicated (HCC)   Mild dementia    Discharge Condition: Improved.  Diet recommendation: Low sodium, heart healthy.  Carbohydrate-modified.  Wound care: None.  Code status: Full.   History of Present Illness:   Arthur Mcdaniel is a 84 y.o. male with medical history significant of afib; dementia; CAD s/p stent; hypothyroidism; and HLD presenting with stroke-like symptoms.  "I'm not sure I can."  The nighbor was called - he got up this morning and was unable to get dressed.  Whne he arrived, he was still trying to dress himself.  He came down the steps fairly well, but at the bottom he had to sit down because he was having trouble standing.  When he tried to stand up, his left leg didn't want to work.  Then his left arm went limp.  EMS arrived and he refused transport.  20 minutes later, the family convinced him to come in.  They called EMS again and transported him.  Left-sided facial droop noticed by the neighbor.  No dysarthria or aphasia.  No dysphagia.  Confusion is somewhat worse although is chronic.  Physically he reports feeling fine, mentally "I don't know what's going on."  He has had a TIA in the past but no stroke.  His neighbor came outside after the evaluation and reported that he has significant concerns about both the patient and his wife.  It appears that the patient has mild-moderate dementia and his wife has moderate dementia  and he has concerns about their ongoing ability to function independently.  He would like ongoing discussions with the patient's daughter (arriving today from [facility]) and son (who lives in [place]) to determine whether placement is needed.   Hospital Course by Problem:   Mr. Arthur Mcdaniel is a 84 y.o. male with history of PAF, memory deficit, melanoma, ischemic heart disease, hypothyroidism, hyperlipidemia, gout, GERD presenting with L sided weakness and L facial droop.    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 Small vessel disease. Atrophy. atherosclerosis     CTA head & neck no ELVO. Atherosclerosis R 40%, L 20%. Distal vessel atherosclerosis. 50% VA origin stenosis.  MRI  R internal capsule infarct  MRA  No ELVO. R>L ICA stenosis. Atherosclerosis.  2D Echo  EF 55-60%. No source of embolus   LDL 57  HgbA1c 6.4  aspirin 81 mg daily and clopidogrel 75 mg daily x 3 weeks then plavix alone    Hx Paroxsymal Atrial Fibrillation  Home anticoagulation:  none   Current stroke is not embolic  Do not feel he is a good anticoagulation candidate given baseline dementia with head-strong ideas and difficulty to get him to take meds, flight risk. Wife also with cognitive deficits and not effective in controlling him  Hypertension  Stable  BP goal normotensive  Hyperlipidemia  LDL 57   resume home statin dose at d/c  Diabetes type  II  HgbA1c 6.4, at goal < 7.0    Medical Consultants:   neuro  Discharge Exam:   Vitals:   01/23/18 0635 01/23/18 1230  BP: (!) 167/61 134/89  Pulse: 70 80  Resp: 16 16  Temp: 98.3 F (36.8 C) 98.1 F (36.7 C)  SpO2: 97%    Vitals:   01/22/18 2233 01/23/18 0136 01/23/18 0635 01/23/18 1230  BP: (!) 149/71 (!) 145/68 (!) 167/61 134/89  Pulse: 85 73 70 80  Resp:  17 16 16   Temp: 98.4 F (36.9 C) 97.9 F (36.6 C) 98.3 F (36.8 C) 98.1 F (36.7 C)  TempSrc:  Oral Oral Oral Oral  SpO2: 98% 95% 97%   Weight:      Height:        General exam: pacing the hallways  The results of significant diagnostics from this hospitalization (including imaging, microbiology, ancillary and laboratory) are listed below for reference.     Procedures and Diagnostic Studies:   Ct Angio Head W Or Wo Contrast  Result Date: 01/22/2018 CLINICAL DATA:  Stroke presentation. Acute onset of left facial droop and left-sided weakness. Negative head CT. EXAM: CT ANGIOGRAPHY HEAD AND NECK TECHNIQUE: Multidetector CT imaging of the head and neck was performed using the standard protocol during bolus administration of intravenous contrast. Multiplanar CT image reconstructions and MIPs were obtained to evaluate the vascular anatomy. Carotid stenosis measurements (when applicable) are obtained utilizing NASCET criteria, using the distal internal carotid diameter as the denominator. CONTRAST:  18mL ISOVUE-370 IOPAMIDOL (ISOVUE-370) INJECTION 76% COMPARISON:  None. FINDINGS: CTA NECK FINDINGS Aortic arch: There is atherosclerotic calcification of the arch. No aneurysm or dissection. Branching pattern of the brachiocephalic vessels is normal without origin stenosis. Right carotid system: Common carotid artery is tortuous but widely patent to the bifurcation region. There is calcified plaque at the carotid bifurcation and ICA bulb. Minimal diameter at the proximal ICA is 3 mm. Compared to a more distal cervical ICA diameter of 5 mm, this indicates a 40% stenosis. Left carotid system: Common carotid artery shows some plaque but no stenosis. The vessel is widely patent to the bifurcation region. There is calcified plaque at the proximal ICA and the bulb region. Minimal diameter is 4 mm. Compared to a more distal cervical ICA diameter of 5 mm, this indicates a 20% stenosis. Vertebral arteries: Both vertebral artery origins are patent, without calcified plaque, but with 50% stenoses just beyond the  origins. Both vertebral arteries appear widely patent beyond that through the cervical region to the foramen magnum. Skeleton: Ordinary cervical spondylosis. Other neck: No mass or lymphadenopathy. Upper chest: Negative Review of the MIP images confirms the above findings CTA HEAD FINDINGS Anterior circulation: Both internal carotid arteries are patent through the skull base and siphon regions. There is atherosclerotic calcification in the carotid siphon regions but no stenosis greater than 50%. Supraclinoid stenosis of the ICA on the right is 70%. No a 1 segment is seen on the right. No flow limiting stenosis of the M1 or M2 segments. No missing vessel identified. On the left, there is supraclinoid stenosis of 50%. The left ICA supplies the left middle cerebral artery territory and both anterior cerebral artery territories. No flow limiting stenosis in those vessels or discernible branch vessel occlusion. Distal vessels show atherosclerotic irregularity. Posterior circulation: Both vertebral arteries are patent at the foramen magnum. There is atherosclerotic calcification at the dural penetration and in the V4 segments with no stenosis greater than 30% suspected. Both vertebral  arteries give supply to the basilar. No basilar stenosis. Posterior circulation branch vessels appear patent and normal in their proximal extent. Distal branch vessels show atherosclerotic irregularity. Venous sinuses: Patent and normal. Anatomic variants: None significant. Delayed phase: No abnormal enhancement. Review of the MIP images confirms the above findings IMPRESSION: No acute large or medium vessel occlusion identified. Atherosclerotic calcification at both carotid bifurcation and ICA regions. 40% stenosis of the proximal ICA on the right. 20% stenosis of the proximal ICA on the left. Atherosclerotic disease in the carotid siphon regions and supraclinoid internal carotid arteries. 70% stenosis of the supraclinoid ICA on the right.  50% stenosis of the supraclinoid ICA on the left. Distal vessel intracranial atherosclerotic irregularity of the anterior circulation branches. 50% stenosis at each vertebral artery origin. Plaque of the distal vertebral arteries but without stenosis greater than 30% suspected. No vertebral stenosis. Posterior circulation branch vessels show distal atherosclerotic irregularity. Electronically Signed   By: Nelson Chimes M.D.   On: 01/22/2018 12:01   Ct Angio Neck W Or Wo Contrast  Result Date: 01/22/2018 CLINICAL DATA:  Stroke presentation. Acute onset of left facial droop and left-sided weakness. Negative head CT. EXAM: CT ANGIOGRAPHY HEAD AND NECK TECHNIQUE: Multidetector CT imaging of the head and neck was performed using the standard protocol during bolus administration of intravenous contrast. Multiplanar CT image reconstructions and MIPs were obtained to evaluate the vascular anatomy. Carotid stenosis measurements (when applicable) are obtained utilizing NASCET criteria, using the distal internal carotid diameter as the denominator. CONTRAST:  46mL ISOVUE-370 IOPAMIDOL (ISOVUE-370) INJECTION 76% COMPARISON:  None. FINDINGS: CTA NECK FINDINGS Aortic arch: There is atherosclerotic calcification of the arch. No aneurysm or dissection. Branching pattern of the brachiocephalic vessels is normal without origin stenosis. Right carotid system: Common carotid artery is tortuous but widely patent to the bifurcation region. There is calcified plaque at the carotid bifurcation and ICA bulb. Minimal diameter at the proximal ICA is 3 mm. Compared to a more distal cervical ICA diameter of 5 mm, this indicates a 40% stenosis. Left carotid system: Common carotid artery shows some plaque but no stenosis. The vessel is widely patent to the bifurcation region. There is calcified plaque at the proximal ICA and the bulb region. Minimal diameter is 4 mm. Compared to a more distal cervical ICA diameter of 5 mm, this indicates a 20%  stenosis. Vertebral arteries: Both vertebral artery origins are patent, without calcified plaque, but with 50% stenoses just beyond the origins. Both vertebral arteries appear widely patent beyond that through the cervical region to the foramen magnum. Skeleton: Ordinary cervical spondylosis. Other neck: No mass or lymphadenopathy. Upper chest: Negative Review of the MIP images confirms the above findings CTA HEAD FINDINGS Anterior circulation: Both internal carotid arteries are patent through the skull base and siphon regions. There is atherosclerotic calcification in the carotid siphon regions but no stenosis greater than 50%. Supraclinoid stenosis of the ICA on the right is 70%. No a 1 segment is seen on the right. No flow limiting stenosis of the M1 or M2 segments. No missing vessel identified. On the left, there is supraclinoid stenosis of 50%. The left ICA supplies the left middle cerebral artery territory and both anterior cerebral artery territories. No flow limiting stenosis in those vessels or discernible branch vessel occlusion. Distal vessels show atherosclerotic irregularity. Posterior circulation: Both vertebral arteries are patent at the foramen magnum. There is atherosclerotic calcification at the dural penetration and in the V4 segments with no stenosis greater than  30% suspected. Both vertebral arteries give supply to the basilar. No basilar stenosis. Posterior circulation branch vessels appear patent and normal in their proximal extent. Distal branch vessels show atherosclerotic irregularity. Venous sinuses: Patent and normal. Anatomic variants: None significant. Delayed phase: No abnormal enhancement. Review of the MIP images confirms the above findings IMPRESSION: No acute large or medium vessel occlusion identified. Atherosclerotic calcification at both carotid bifurcation and ICA regions. 40% stenosis of the proximal ICA on the right. 20% stenosis of the proximal ICA on the left.  Atherosclerotic disease in the carotid siphon regions and supraclinoid internal carotid arteries. 70% stenosis of the supraclinoid ICA on the right. 50% stenosis of the supraclinoid ICA on the left. Distal vessel intracranial atherosclerotic irregularity of the anterior circulation branches. 50% stenosis at each vertebral artery origin. Plaque of the distal vertebral arteries but without stenosis greater than 30% suspected. No vertebral stenosis. Posterior circulation branch vessels show distal atherosclerotic irregularity. Electronically Signed   By: Nelson Chimes M.D.   On: 01/22/2018 12:01   Mr Jodene Nam Head Wo Contrast  Result Date: 01/23/2018 CLINICAL DATA:  Initial evaluation for acute stroke, left-sided weakness. EXAM: MRI HEAD WITHOUT CONTRAST MRA HEAD WITHOUT CONTRAST TECHNIQUE: Multiplanar, multiecho pulse sequences of the brain and surrounding structures were obtained without intravenous contrast. Angiographic images of the head were obtained using MRA technique without contrast. COMPARISON:  Comparison made with prior CT and CTA from earlier the same day. FINDINGS: MRI HEAD FINDINGS Brain: Mild age-related cerebral atrophy. No significant cerebral white matter change for age. 13 mm focus of diffusion abnormality at the superior right internal capsule (series 5, image 58). Associated T2/FLAIR signal abnormality without definite ADC correlate. Finding most consistent with an early subacute ischemic infarct. No associated hemorrhage or mass effect. No other evidence for acute or subacute ischemia. Gray-white matter differentiation otherwise maintained. No other areas of remote or chronic infarction. No acute or chronic intracranial hemorrhage. No mass lesion, midline shift or mass effect. No hydrocephalus. No extra-axial fluid collection. Normal pituitary gland. Vascular: Major intravascular flow voids maintained. Skull and upper cervical spine: Craniocervical junction normal. No focal marrow replacing  lesion. Scalp soft tissues unremarkable. Sinuses/Orbits: Globes and orbital soft tissues within normal limits. Mild scattered mucosal thickening throughout the paranasal sinuses. No air-fluid level to suggest acute sinusitis. Small bilateral mastoid effusions, of doubtful significance. Inner ear structures grossly normal. Other: None. MRA HEAD FINDINGS ANTERIOR CIRCULATION: Distal cervical segments of the internal carotid arteries are patent with antegrade flow. Petrous segments widely patent bilaterally. Scattered atheromatous irregularity within the cavernous/supraclinoid ICAs, right greater than left. Approximate 70% stenosis at the supraclinoid right ICA and 50% stenosis at the left, stable from previous. Left A1 widely patent. Right A1 hypoplastic and/or absent. Normal anterior communicating artery. Anterior cerebral arteries patent to their distal aspects without stenosis. M1 segments widely patent without stenosis. Short-segment severe right M2 stenosis, anterior temporal branch, stable from previous (series 7, image 122). Distal MCA branches well perfused and symmetric. Distal small vessel atheromatous irregularity. POSTERIOR CIRCULATION: Vertebral arteries patent to the vertebrobasilar junction without flow-limiting stenosis. Left vertebral artery slightly dominant. Right PICA partially visualized and widely patent. Left PICA not seen. Basilar widely patent to its distal aspect. Superior cerebral arteries patent bilaterally. Both of the posterior cerebral artery supplied via the basilar and are well perfused to their distal aspects. Short-segment moderate left P2 stenosis (series 1032, image 12). PCAs otherwise widely patent to their distal aspects. No intracranial aneurysm. IMPRESSION: MRI HEAD IMPRESSION: 1.  13 mm early subacute nonhemorrhagic ischemic infarct involving the superior right internal capsule. 2. Otherwise unremarkable brain MRI for age. MRA HEAD IMPRESSION: 1. Negative intracranial MRA for  large vessel occlusion. 2. Moderate atheromatous irregularity with and stenoses involving the supraclinoid ICAs bilaterally, right greater than left. 3. Additional atherosclerotic change elsewhere within the intracranial circulation as above. No other proximal high-grade or correctable stenosis identified. Electronically Signed   By: Jeannine Boga M.D.   On: 01/23/2018 00:19   Mr Brain Wo Contrast  Result Date: 01/23/2018 CLINICAL DATA:  Initial evaluation for acute stroke, left-sided weakness. EXAM: MRI HEAD WITHOUT CONTRAST MRA HEAD WITHOUT CONTRAST TECHNIQUE: Multiplanar, multiecho pulse sequences of the brain and surrounding structures were obtained without intravenous contrast. Angiographic images of the head were obtained using MRA technique without contrast. COMPARISON:  Comparison made with prior CT and CTA from earlier the same day. FINDINGS: MRI HEAD FINDINGS Brain: Mild age-related cerebral atrophy. No significant cerebral white matter change for age. 13 mm focus of diffusion abnormality at the superior right internal capsule (series 5, image 58). Associated T2/FLAIR signal abnormality without definite ADC correlate. Finding most consistent with an early subacute ischemic infarct. No associated hemorrhage or mass effect. No other evidence for acute or subacute ischemia. Gray-white matter differentiation otherwise maintained. No other areas of remote or chronic infarction. No acute or chronic intracranial hemorrhage. No mass lesion, midline shift or mass effect. No hydrocephalus. No extra-axial fluid collection. Normal pituitary gland. Vascular: Major intravascular flow voids maintained. Skull and upper cervical spine: Craniocervical junction normal. No focal marrow replacing lesion. Scalp soft tissues unremarkable. Sinuses/Orbits: Globes and orbital soft tissues within normal limits. Mild scattered mucosal thickening throughout the paranasal sinuses. No air-fluid level to suggest acute  sinusitis. Small bilateral mastoid effusions, of doubtful significance. Inner ear structures grossly normal. Other: None. MRA HEAD FINDINGS ANTERIOR CIRCULATION: Distal cervical segments of the internal carotid arteries are patent with antegrade flow. Petrous segments widely patent bilaterally. Scattered atheromatous irregularity within the cavernous/supraclinoid ICAs, right greater than left. Approximate 70% stenosis at the supraclinoid right ICA and 50% stenosis at the left, stable from previous. Left A1 widely patent. Right A1 hypoplastic and/or absent. Normal anterior communicating artery. Anterior cerebral arteries patent to their distal aspects without stenosis. M1 segments widely patent without stenosis. Short-segment severe right M2 stenosis, anterior temporal branch, stable from previous (series 7, image 122). Distal MCA branches well perfused and symmetric. Distal small vessel atheromatous irregularity. POSTERIOR CIRCULATION: Vertebral arteries patent to the vertebrobasilar junction without flow-limiting stenosis. Left vertebral artery slightly dominant. Right PICA partially visualized and widely patent. Left PICA not seen. Basilar widely patent to its distal aspect. Superior cerebral arteries patent bilaterally. Both of the posterior cerebral artery supplied via the basilar and are well perfused to their distal aspects. Short-segment moderate left P2 stenosis (series 1032, image 12). PCAs otherwise widely patent to their distal aspects. No intracranial aneurysm. IMPRESSION: MRI HEAD IMPRESSION: 1. 13 mm early subacute nonhemorrhagic ischemic infarct involving the superior right internal capsule. 2. Otherwise unremarkable brain MRI for age. MRA HEAD IMPRESSION: 1. Negative intracranial MRA for large vessel occlusion. 2. Moderate atheromatous irregularity with and stenoses involving the supraclinoid ICAs bilaterally, right greater than left. 3. Additional atherosclerotic change elsewhere within the  intracranial circulation as above. No other proximal high-grade or correctable stenosis identified. Electronically Signed   By: Jeannine Boga M.D.   On: 01/23/2018 00:19   Ct Head Code Stroke Wo Contrast  Result Date: 01/22/2018 CLINICAL DATA:  Code stroke. Acute onset of left-sided facial droop and left-sided weakness. EXAM: CT HEAD WITHOUT CONTRAST TECHNIQUE: Contiguous axial images were obtained from the base of the skull through the vertex without intravenous contrast. COMPARISON:  None. FINDINGS: Brain: No acute infarct, hemorrhage, or mass lesion is present. Basal ganglia are intact. Insular ribbon is normal. No acute or focal cortical abnormality is present. Mild white matter changes are present. The brainstem and cerebellum are normal. Vascular: Atherosclerotic calcifications are present in the cavernous internal carotid arteries bilaterally. No asymmetric hyperdense vessel is present. Skull: Calvarium is intact. No focal lytic or blastic lesions are present. Sinuses/Orbits: The paranasal sinuses and mastoid air cells are clear. Globes and orbits are within normal limits. ASPECTS [facility] Stroke Program Early CT Score) - Ganglionic level infarction (caudate, lentiform nuclei, internal capsule, insula, M1-M3 cortex): 7/7 - Supraganglionic infarction (M4-M6 cortex): 3/3 Total score (0-10 with 10 being normal): 10/10 IMPRESSION: 1. No acute intracranial abnormality. 2. Mild atrophy and white matter changes are within normal limits for age. 3. Atherosclerosis 4. ASPECTS is 10/10 The above was relayed via text pager to Dr. Lorraine Lax on 01/22/2018 at 10:59 . Electronically Signed   By: San Morelle M.D.   On: 01/22/2018 11:00     Labs:   Basic Metabolic Panel: Recent Labs  Lab 01/22/18 1049 01/22/18 1055  NA 139 139  K 4.0 3.8  CL 105 104  CO2 21*  --   GLUCOSE 167* 168*  BUN 15 16  CREATININE 1.25* 1.10  CALCIUM 9.4  --    GFR Estimated Creatinine Clearance: 52.3 mL/min (by C-G  formula based on SCr of 1.1 mg/dL). Liver Function Tests: Recent Labs  Lab 01/22/18 1049  AST 24  ALT 17  ALKPHOS 59  BILITOT 1.3*  PROT 6.5  ALBUMIN 4.0   No results for input(s): LIPASE, AMYLASE in the last 168 hours. No results for input(s): AMMONIA in the last 168 hours. Coagulation profile Recent Labs  Lab 01/22/18 1049  INR 1.11    CBC: Recent Labs  Lab 01/22/18 1049 01/22/18 1055  WBC 9.3  --   NEUTROABS 7.1  --   HGB 13.7 13.9  HCT 41.4 41.0  MCV 104.0*  --   PLT 177  --    Cardiac Enzymes: No results for input(s): CKTOTAL, CKMB, CKMBINDEX, TROPONINI in the last 168 hours. BNP: Invalid input(s): POCBNP CBG: Recent Labs  Lab 01/22/18 1627 01/22/18 2119 01/23/18 0737 01/23/18 0754 01/23/18 1221  GLUCAP 146* 134* 136* 201* 159*   D-Dimer No results for input(s): DDIMER in the last 72 hours. Hgb A1c Recent Labs    01/22/18 1317  HGBA1C 6.4*   Lipid Profile Recent Labs    01/23/18 0332  CHOL 136  HDL 56  LDLCALC 57  TRIG 114  CHOLHDL 2.4   Thyroid function studies Recent Labs    01/22/18 1317  TSH 31.997*   Anemia work up No results for input(s): VITAMINB12, FOLATE, FERRITIN, TIBC, IRON, RETICCTPCT in the last 72 hours. Microbiology No results found for this or any previous visit (from the past 240 hour(s)).   Discharge Instructions:   Discharge Instructions    Ambulatory referral to Neurology   Complete by:  As directed    Follow up with stroke clinic NP (Angeles Paolucci Vanschaick or Cecille Rubin, if both not available, consider Dr. Antony Contras, Dr. Bess Harvest, or Dr. Sarina Ill) at [facility] in about 4 weeks.   Diet - low sodium heart healthy  Complete by:  As directed    Diet Carb Modified   Complete by:  As directed    Discharge instructions   Complete by:  As directed    Asa/plavix x 3 weeks then plavix alone Home health and 24/7 supervision by family   Increase activity slowly   Complete by:   As directed      Allergies as of 01/23/2018      Reactions   Naproxen Other (See Comments)   Doesn't remember       Medication List    STOP taking these medications   metFORMIN 850 MG tablet Commonly known as:  GLUCOPHAGE     TAKE these medications   allopurinol 300 MG tablet Commonly known as:  ZYLOPRIM Take 300 mg by mouth daily.   amLODipine 2.5 MG tablet Commonly known as:  NORVASC Take 1 tablet (2.5 mg total) by mouth daily.   aspirin 81 MG tablet Take 81 mg by mouth daily.   clopidogrel 75 MG tablet Commonly known as:  PLAVIX Take 1 tablet (75 mg total) by mouth daily. Start taking on:  01/24/2018   donepezil 10 MG tablet Commonly known as:  ARICEPT Take 1 tablet at bedtime What changed:    how much to take  how to take this  when to take this  additional instructions   KOMBIGLYZE XR 09-998 MG Tb24 Generic drug:  Saxagliptin-Metformin Take 1 tablet by mouth daily.   levothyroxine 112 MCG tablet Commonly known as:  SYNTHROID, LEVOTHROID Take 112 mcg by mouth daily before breakfast. Take 1 tablet daily   nitroGLYCERIN 0.4 MG SL tablet Commonly known as:  NITROSTAT Place 1 tablet (0.4 mg total) under the tongue every 5 (five) minutes as needed. What changed:  reasons to take this   pyridOXINE 100 MG tablet Commonly known as:  VITAMIN B-6 Take 100 mg by mouth daily.   simvastatin 20 MG tablet Commonly known as:  ZOCOR Take 20 mg by mouth at bedtime.   vitamin B-12 1000 MCG tablet Commonly known as:  CYANOCOBALAMIN Take 1,000 mcg by mouth daily.      Follow-up Information    [facility] Follow up in 4 week(s).   Specialty:  Neurology Why:  stroke clinic. office will call with appt date and time Contact information: [facility] Follow up.   Why:  Rolling walker Contact information: [address] [place]  [address] [phone]        [facility] Follow [facility].   Specialty:  Home Health Services Why:  Physical Therapy, Occupational therapy, speech therapy and social worker Contact information: [place] [address] [phone]        Deland Pretty, MD Follow up in 1 week(s).   Specialty:  Internal Medicine Contact information: [address] [place] [address] [phone]            Time coordinating discharge: 35 min  Signed:  Geradine Girt  [facility] 01/23/2018, 4:25 PM

## 2018-01-23 NOTE — Evaluation (Signed)
Physical Therapy Evaluation Patient Details Name: Arthur Mcdaniel MRN: [id number] DOB: 15-Jul-1930 Today's Date: 01/23/2018   History of Present Illness  84 yo male with onset of L side weakness and facial mm changes was sent to ED, worsened while being assessed by nursing.  Pt referred to PT for mobility and safety, but is hindered by dementia to focus on instructions.  PMHx:  melanoma, A-fib, dementia, CAD with stent, detached retina, PAF, DM, HLD, gout, GERD, HTN, cholecystitis, polyneuropathy,   Clinical Impression  Pt was seen for evaluation of mobility with nursing in the room for a  Significant portion of time.  His plan is to go home with wife and follow up with HHPT and then could transition to outpatient when ready.  Surprisingly his RLE is a half grade of strength less than LLE but LLE was weak in ED.  Will continue on with his therapy and stress safety and balance as pt is not seeing the point of all the PT guidelines for walker use.  He only has a SPC which is not going to offer support as needed at home.    Follow Up Recommendations Home health PT;Supervision for mobility/OOB    Equipment Recommendations  Rolling walker with 5" wheels    Recommendations for Other Services       Precautions / Restrictions Precautions Precautions: Fall Precaution Comments: telemetry Restrictions Weight Bearing Restrictions: No      Mobility  Bed Mobility Overal bed mobility: Needs Assistance Bed Mobility: Supine to Sit;Sit to Supine     Supine to sit: Supervision Sit to supine: Supervision   General bed mobility comments: pt is able to transition in and out of bed with supervised effort  Transfers Overall transfer level: Needs assistance Equipment used: None Transfers: Sit to/from Stand Sit to Stand: Min guard         General transfer comment: used min guard for safety as pt is not aware of his LOB with standing, slow to respond with protective  reactions  Ambulation/Gait Ambulation/Gait assistance: Min assist;Min guard Gait Distance (Feet): 150 Feet(100+50) Assistive device: Rolling walker (2 wheeled);1 person hand held assist Gait Pattern/deviations: Step-through pattern;Decreased stride length;Drifts right/left;Wide base of support Gait velocity: reduced   General Gait Details: pt is mildly unsteady but is not cognitively controlling balance with no device  Stairs Stairs: Yes Stairs assistance: Min guard Stair Management: One rail Right;Alternating pattern;Forwards Number of Stairs: 4 General stair comments: pt is able to follow instructions on stairs and handled better with a secure hand rail to manage his safety  Wheelchair Mobility    Modified Rankin (Stroke Patients Only) Modified Rankin (Stroke Patients Only) Pre-Morbid Rankin Score: No symptoms Modified Rankin: Moderate disability     Balance Overall balance assessment: Needs assistance Sitting-balance support: Feet supported Sitting balance-Leahy Scale: Good     Standing balance support: Bilateral upper extremity supported;During functional activity Standing balance-Leahy Scale: Fair Standing balance comment: less than fair dynamically and pt is quick to move once up                             Pertinent Vitals/Pain Pain Assessment: No/denies pain    Home Living Family/patient expects to be discharged to:: Private residence Living Arrangements: Spouse/significant other Available Help at Discharge: Family;Available 24 hours/day Type of Home: House Home Access: Stairs to enter Entrance Stairs-Rails: Right;Left;Can reach both Entrance Stairs-Number of Steps: 3 Home Layout: Two level Home Equipment: Cane - single  point Additional Comments: pt is source of home information and is definitely having memory issues, may be incorrect    Prior Function Level of Independence: Independent(per pt)               Hand Dominance   Dominant  Hand: Right    Extremity/Trunk Assessment   Upper Extremity Assessment Upper Extremity Assessment: Overall WFL for tasks assessed    Lower Extremity Assessment Lower Extremity Assessment: RLE deficits/detail RLE Deficits / Details: 4+ RLE strength    Cervical / Trunk Assessment Cervical / Trunk Assessment: Normal  Communication   Communication: No difficulties  Cognition Arousal/Alertness: Awake/alert Behavior During Therapy: Impulsive Overall Cognitive Status: History of cognitive impairments - at baseline                                 General Comments: standing and trying to leave walker despite PT efforts to direct mobility      General Comments      Exercises     Assessment/Plan    PT Assessment Patient needs continued PT services  PT Problem List Decreased strength;Decreased range of motion;Decreased activity tolerance;Decreased balance;Decreased mobility;Decreased coordination;Decreased cognition;Decreased knowledge of use of DME;Decreased safety awareness;Cardiopulmonary status limiting activity       PT Treatment Interventions DME instruction;Gait training;Stair training;Functional mobility training;Therapeutic activities;Therapeutic exercise;Balance training;Neuromuscular re-education;Patient/family education    PT Goals (Current goals can be found in the Care Plan section)  Acute Rehab PT Goals Patient Stated Goal: to go home today PT Goal Formulation: With patient Time For Goal Achievement: 01/30/18 Potential to Achieve Goals: Good    Frequency Min 3X/week   Barriers to discharge Inaccessible home environment stairs within and outside his home    Co-evaluation               AM-PAC PT "6 Clicks" Daily Activity  Outcome Measure Difficulty turning over in bed (including adjusting bedclothes, sheets and blankets)?: A Little Difficulty moving from lying on back to sitting on the side of the bed? : A Little Difficulty sitting down  on and standing up from a chair with arms (e.g., wheelchair, bedside commode, etc,.)?: Unable Help needed moving to and from a bed to chair (including a wheelchair)?: A Little Help needed walking in hospital room?: A Little Help needed climbing 3-5 steps with a railing? : A Little 6 Click Score: 16    End of Session Equipment Utilized During Treatment: Gait belt Activity Tolerance: Other (comment);Patient limited by fatigue(limited by cognition for safety of mobility) Patient left: in bed;with call bell/phone within reach;with bed alarm set Nurse Communication: Mobility status PT Visit Diagnosis: Unsteadiness on feet (R26.81);Muscle weakness (generalized) (M62.81);Ataxic gait (R26.0)    Time: 9381-0175 PT Time Calculation (min) (ACUTE ONLY): 24 min   Charges:   PT Evaluation $PT Eval Moderate Complexity: 1 Mod PT Treatments $Gait Training: 8-22 mins       Ramond Dial 01/23/2018, 9:04 AM   Mee Hives, PT MS Acute Rehab Dept. Number: [place] [facility] [place]

## 2018-01-23 NOTE — Care Management Obs Status (Signed)
[place] NOTIFICATION   Patient Details  Name: Arthur Mcdaniel MRN: [id number] Date of Birth: Jun 12, 1930   Medicare Observation Status Notification Given:  Yes    Midge Minium RN, BSN, NCM-BC, ACM-RN [phone] 01/23/2018, 11:49 AM

## 2018-01-23 NOTE — Progress Notes (Addendum)
STROKE TEAM PROGRESS NOTE  HPI:( Dr Lorraine Lax)  Arthur Mcdaniel is a 84 y.o. male with history of PAF, memory deficit, melanoma, ischemic heart disease, hypothyroidism, hyperlipidemia, gout, GERD.  Patient went to sleep at approximately 11:00 PM last night.  Upon waking this morning, and he was noted that when he tried to get out of bed he slid down to floor secondary to weakness of his left leg and left arm.  Initially EMS was called, upon arrival patient and wife refused transport hospital.  Over time patient lost all strength in his left arm and left leg.  Neighbor came over and convinced him to call EMS and go to the hospital.  Upon entering the hospital patient strength in his left arm and left leg had improved but was not back to baseline. However, the emergency department left arm and leg weakness returned and patient was stroke alerted. CT head was performed which showed no bleed.  CT angiogram was performed which showed no large vessel occlusion, patient does have right ICA stenosis 70%.  He is not a TPA candidate as he is outside the window.   LKW: 01/22/2018 at 2300 hrs. tpa given?: no, out of window Premorbid modified Rankin scale (mRS): 0 NIH stroke score: 6  INTERVAL HISTORY His wife and daughter are at the bedside.  Pt dressed and ready for d/c.  Vitals:   01/22/18 2125 01/22/18 2233 01/23/18 0136 01/23/18 0635  BP:  (!) 149/71 (!) 145/68 (!) 167/61  Pulse:  85 73 70  Resp:   17 16  Temp:  98.4 F (36.9 C) 97.9 F (36.6 C) 98.3 F (36.8 C)  TempSrc:  Oral Oral Oral  SpO2:  98% 95% 97%  Weight: 87.5 kg     Height:        CBC:  Recent Labs  Lab 01/22/18 1049 01/22/18 1055  WBC 9.3  --   NEUTROABS 7.1  --   HGB 13.7 13.9  HCT 41.4 41.0  MCV 104.0*  --   PLT 177  --     Basic Metabolic Panel:  Recent Labs  Lab 01/22/18 1049 01/22/18 1055  NA 139 139  K 4.0 3.8  CL 105 104  CO2 21*  --   GLUCOSE 167* 168*  BUN 15 16  CREATININE 1.25* 1.10  CALCIUM 9.4  --     Lipid Panel:     Component Value Date/Time   CHOL 136 01/23/2018 0332   TRIG 114 01/23/2018 0332   HDL 56 01/23/2018 0332   CHOLHDL 2.4 01/23/2018 0332   VLDL 23 01/23/2018 0332   LDLCALC 57 01/23/2018 0332   HgbA1c:  Lab Results  Component Value Date   HGBA1C 6.4 (H) 01/22/2018   Urine Drug Screen:     Component Value Date/Time   LABOPIA NONE DETECTED 01/22/2018 1120   COCAINSCRNUR NONE DETECTED 01/22/2018 1120   LABBENZ NONE DETECTED 01/22/2018 1120   AMPHETMU NONE DETECTED 01/22/2018 1120   THCU NONE DETECTED 01/22/2018 1120   LABBARB NONE DETECTED 01/22/2018 1120    Alcohol Level     Component Value Date/Time   ETH <10 01/22/2018 1049    IMAGING Ct Angio Head W Or Wo Contrast  Result Date: 01/22/2018 CLINICAL DATA:  Stroke presentation. Acute onset of left facial droop and left-sided weakness. Negative head CT. EXAM: CT ANGIOGRAPHY HEAD AND NECK TECHNIQUE: Multidetector CT imaging of the head and neck was performed using the standard protocol during bolus administration of intravenous contrast. Multiplanar CT  image reconstructions and MIPs were obtained to evaluate the vascular anatomy. Carotid stenosis measurements (when applicable) are obtained utilizing NASCET criteria, using the distal internal carotid diameter as the denominator. CONTRAST:  67mL ISOVUE-370 IOPAMIDOL (ISOVUE-370) INJECTION 76% COMPARISON:  None. FINDINGS: CTA NECK FINDINGS Aortic arch: There is atherosclerotic calcification of the arch. No aneurysm or dissection. Branching pattern of the brachiocephalic vessels is normal without origin stenosis. Right carotid system: Common carotid artery is tortuous but widely patent to the bifurcation region. There is calcified plaque at the carotid bifurcation and ICA bulb. Minimal diameter at the proximal ICA is 3 mm. Compared to a more distal cervical ICA diameter of 5 mm, this indicates a 40% stenosis. Left carotid system: Common carotid artery shows some plaque  but no stenosis. The vessel is widely patent to the bifurcation region. There is calcified plaque at the proximal ICA and the bulb region. Minimal diameter is 4 mm. Compared to a more distal cervical ICA diameter of 5 mm, this indicates a 20% stenosis. Vertebral arteries: Both vertebral artery origins are patent, without calcified plaque, but with 50% stenoses just beyond the origins. Both vertebral arteries appear widely patent beyond that through the cervical region to the foramen magnum. Skeleton: Ordinary cervical spondylosis. Other neck: No mass or lymphadenopathy. Upper chest: Negative Review of the MIP images confirms the above findings CTA HEAD FINDINGS Anterior circulation: Both internal carotid arteries are patent through the skull base and siphon regions. There is atherosclerotic calcification in the carotid siphon regions but no stenosis greater than 50%. Supraclinoid stenosis of the ICA on the right is 70%. No a 1 segment is seen on the right. No flow limiting stenosis of the M1 or M2 segments. No missing vessel identified. On the left, there is supraclinoid stenosis of 50%. The left ICA supplies the left middle cerebral artery territory and both anterior cerebral artery territories. No flow limiting stenosis in those vessels or discernible branch vessel occlusion. Distal vessels show atherosclerotic irregularity. Posterior circulation: Both vertebral arteries are patent at the foramen magnum. There is atherosclerotic calcification at the dural penetration and in the V4 segments with no stenosis greater than 30% suspected. Both vertebral arteries give supply to the basilar. No basilar stenosis. Posterior circulation branch vessels appear patent and normal in their proximal extent. Distal branch vessels show atherosclerotic irregularity. Venous sinuses: Patent and normal. Anatomic variants: None significant. Delayed phase: No abnormal enhancement. Review of the MIP images confirms the above findings  IMPRESSION: No acute large or medium vessel occlusion identified. Atherosclerotic calcification at both carotid bifurcation and ICA regions. 40% stenosis of the proximal ICA on the right. 20% stenosis of the proximal ICA on the left. Atherosclerotic disease in the carotid siphon regions and supraclinoid internal carotid arteries. 70% stenosis of the supraclinoid ICA on the right. 50% stenosis of the supraclinoid ICA on the left. Distal vessel intracranial atherosclerotic irregularity of the anterior circulation branches. 50% stenosis at each vertebral artery origin. Plaque of the distal vertebral arteries but without stenosis greater than 30% suspected. No vertebral stenosis. Posterior circulation branch vessels show distal atherosclerotic irregularity. Electronically Signed   By: Nelson Chimes M.D.   On: 01/22/2018 12:01   Ct Angio Neck W Or Wo Contrast  Result Date: 01/22/2018 CLINICAL DATA:  Stroke presentation. Acute onset of left facial droop and left-sided weakness. Negative head CT. EXAM: CT ANGIOGRAPHY HEAD AND NECK TECHNIQUE: Multidetector CT imaging of the head and neck was performed using the standard protocol during bolus administration of  intravenous contrast. Multiplanar CT image reconstructions and MIPs were obtained to evaluate the vascular anatomy. Carotid stenosis measurements (when applicable) are obtained utilizing NASCET criteria, using the distal internal carotid diameter as the denominator. CONTRAST:  71mL ISOVUE-370 IOPAMIDOL (ISOVUE-370) INJECTION 76% COMPARISON:  None. FINDINGS: CTA NECK FINDINGS Aortic arch: There is atherosclerotic calcification of the arch. No aneurysm or dissection. Branching pattern of the brachiocephalic vessels is normal without origin stenosis. Right carotid system: Common carotid artery is tortuous but widely patent to the bifurcation region. There is calcified plaque at the carotid bifurcation and ICA bulb. Minimal diameter at the proximal ICA is 3 mm. Compared  to a more distal cervical ICA diameter of 5 mm, this indicates a 40% stenosis. Left carotid system: Common carotid artery shows some plaque but no stenosis. The vessel is widely patent to the bifurcation region. There is calcified plaque at the proximal ICA and the bulb region. Minimal diameter is 4 mm. Compared to a more distal cervical ICA diameter of 5 mm, this indicates a 20% stenosis. Vertebral arteries: Both vertebral artery origins are patent, without calcified plaque, but with 50% stenoses just beyond the origins. Both vertebral arteries appear widely patent beyond that through the cervical region to the foramen magnum. Skeleton: Ordinary cervical spondylosis. Other neck: No mass or lymphadenopathy. Upper chest: Negative Review of the MIP images confirms the above findings CTA HEAD FINDINGS Anterior circulation: Both internal carotid arteries are patent through the skull base and siphon regions. There is atherosclerotic calcification in the carotid siphon regions but no stenosis greater than 50%. Supraclinoid stenosis of the ICA on the right is 70%. No a 1 segment is seen on the right. No flow limiting stenosis of the M1 or M2 segments. No missing vessel identified. On the left, there is supraclinoid stenosis of 50%. The left ICA supplies the left middle cerebral artery territory and both anterior cerebral artery territories. No flow limiting stenosis in those vessels or discernible branch vessel occlusion. Distal vessels show atherosclerotic irregularity. Posterior circulation: Both vertebral arteries are patent at the foramen magnum. There is atherosclerotic calcification at the dural penetration and in the V4 segments with no stenosis greater than 30% suspected. Both vertebral arteries give supply to the basilar. No basilar stenosis. Posterior circulation branch vessels appear patent and normal in their proximal extent. Distal branch vessels show atherosclerotic irregularity. Venous sinuses: Patent and  normal. Anatomic variants: None significant. Delayed phase: No abnormal enhancement. Review of the MIP images confirms the above findings IMPRESSION: No acute large or medium vessel occlusion identified. Atherosclerotic calcification at both carotid bifurcation and ICA regions. 40% stenosis of the proximal ICA on the right. 20% stenosis of the proximal ICA on the left. Atherosclerotic disease in the carotid siphon regions and supraclinoid internal carotid arteries. 70% stenosis of the supraclinoid ICA on the right. 50% stenosis of the supraclinoid ICA on the left. Distal vessel intracranial atherosclerotic irregularity of the anterior circulation branches. 50% stenosis at each vertebral artery origin. Plaque of the distal vertebral arteries but without stenosis greater than 30% suspected. No vertebral stenosis. Posterior circulation branch vessels show distal atherosclerotic irregularity. Electronically Signed   By: Nelson Chimes M.D.   On: 01/22/2018 12:01   Mr Jodene Nam Head Wo Contrast  Result Date: 01/23/2018 CLINICAL DATA:  Initial evaluation for acute stroke, left-sided weakness. EXAM: MRI HEAD WITHOUT CONTRAST MRA HEAD WITHOUT CONTRAST TECHNIQUE: Multiplanar, multiecho pulse sequences of the brain and surrounding structures were obtained without intravenous contrast. Angiographic images of the head  were obtained using MRA technique without contrast. COMPARISON:  Comparison made with prior CT and CTA from earlier the same day. FINDINGS: MRI HEAD FINDINGS Brain: Mild age-related cerebral atrophy. No significant cerebral white matter change for age. 13 mm focus of diffusion abnormality at the superior right internal capsule (series 5, image 58). Associated T2/FLAIR signal abnormality without definite ADC correlate. Finding most consistent with an early subacute ischemic infarct. No associated hemorrhage or mass effect. No other evidence for acute or subacute ischemia. Gray-white matter differentiation otherwise  maintained. No other areas of remote or chronic infarction. No acute or chronic intracranial hemorrhage. No mass lesion, midline shift or mass effect. No hydrocephalus. No extra-axial fluid collection. Normal pituitary gland. Vascular: Major intravascular flow voids maintained. Skull and upper cervical spine: Craniocervical junction normal. No focal marrow replacing lesion. Scalp soft tissues unremarkable. Sinuses/Orbits: Globes and orbital soft tissues within normal limits. Mild scattered mucosal thickening throughout the paranasal sinuses. No air-fluid level to suggest acute sinusitis. Small bilateral mastoid effusions, of doubtful significance. Inner ear structures grossly normal. Other: None. MRA HEAD FINDINGS ANTERIOR CIRCULATION: Distal cervical segments of the internal carotid arteries are patent with antegrade flow. Petrous segments widely patent bilaterally. Scattered atheromatous irregularity within the cavernous/supraclinoid ICAs, right greater than left. Approximate 70% stenosis at the supraclinoid right ICA and 50% stenosis at the left, stable from previous. Left A1 widely patent. Right A1 hypoplastic and/or absent. Normal anterior communicating artery. Anterior cerebral arteries patent to their distal aspects without stenosis. M1 segments widely patent without stenosis. Short-segment severe right M2 stenosis, anterior temporal branch, stable from previous (series 7, image 122). Distal MCA branches well perfused and symmetric. Distal small vessel atheromatous irregularity. POSTERIOR CIRCULATION: Vertebral arteries patent to the vertebrobasilar junction without flow-limiting stenosis. Left vertebral artery slightly dominant. Right PICA partially visualized and widely patent. Left PICA not seen. Basilar widely patent to its distal aspect. Superior cerebral arteries patent bilaterally. Both of the posterior cerebral artery supplied via the basilar and are well perfused to their distal aspects.  Short-segment moderate left P2 stenosis (series 1032, image 12). PCAs otherwise widely patent to their distal aspects. No intracranial aneurysm. IMPRESSION: MRI HEAD IMPRESSION: 1. 13 mm early subacute nonhemorrhagic ischemic infarct involving the superior right internal capsule. 2. Otherwise unremarkable brain MRI for age. MRA HEAD IMPRESSION: 1. Negative intracranial MRA for large vessel occlusion. 2. Moderate atheromatous irregularity with and stenoses involving the supraclinoid ICAs bilaterally, right greater than left. 3. Additional atherosclerotic change elsewhere within the intracranial circulation as above. No other proximal high-grade or correctable stenosis identified. Electronically Signed   By: Jeannine Boga M.D.   On: 01/23/2018 00:19   Mr Brain Wo Contrast  Result Date: 01/23/2018 CLINICAL DATA:  Initial evaluation for acute stroke, left-sided weakness. EXAM: MRI HEAD WITHOUT CONTRAST MRA HEAD WITHOUT CONTRAST TECHNIQUE: Multiplanar, multiecho pulse sequences of the brain and surrounding structures were obtained without intravenous contrast. Angiographic images of the head were obtained using MRA technique without contrast. COMPARISON:  Comparison made with prior CT and CTA from earlier the same day. FINDINGS: MRI HEAD FINDINGS Brain: Mild age-related cerebral atrophy. No significant cerebral white matter change for age. 13 mm focus of diffusion abnormality at the superior right internal capsule (series 5, image 58). Associated T2/FLAIR signal abnormality without definite ADC correlate. Finding most consistent with an early subacute ischemic infarct. No associated hemorrhage or mass effect. No other evidence for acute or subacute ischemia. Gray-white matter differentiation otherwise maintained. No other areas of remote or  chronic infarction. No acute or chronic intracranial hemorrhage. No mass lesion, midline shift or mass effect. No hydrocephalus. No extra-axial fluid collection. Normal  pituitary gland. Vascular: Major intravascular flow voids maintained. Skull and upper cervical spine: Craniocervical junction normal. No focal marrow replacing lesion. Scalp soft tissues unremarkable. Sinuses/Orbits: Globes and orbital soft tissues within normal limits. Mild scattered mucosal thickening throughout the paranasal sinuses. No air-fluid level to suggest acute sinusitis. Small bilateral mastoid effusions, of doubtful significance. Inner ear structures grossly normal. Other: None. MRA HEAD FINDINGS ANTERIOR CIRCULATION: Distal cervical segments of the internal carotid arteries are patent with antegrade flow. Petrous segments widely patent bilaterally. Scattered atheromatous irregularity within the cavernous/supraclinoid ICAs, right greater than left. Approximate 70% stenosis at the supraclinoid right ICA and 50% stenosis at the left, stable from previous. Left A1 widely patent. Right A1 hypoplastic and/or absent. Normal anterior communicating artery. Anterior cerebral arteries patent to their distal aspects without stenosis. M1 segments widely patent without stenosis. Short-segment severe right M2 stenosis, anterior temporal branch, stable from previous (series 7, image 122). Distal MCA branches well perfused and symmetric. Distal small vessel atheromatous irregularity. POSTERIOR CIRCULATION: Vertebral arteries patent to the vertebrobasilar junction without flow-limiting stenosis. Left vertebral artery slightly dominant. Right PICA partially visualized and widely patent. Left PICA not seen. Basilar widely patent to its distal aspect. Superior cerebral arteries patent bilaterally. Both of the posterior cerebral artery supplied via the basilar and are well perfused to their distal aspects. Short-segment moderate left P2 stenosis (series 1032, image 12). PCAs otherwise widely patent to their distal aspects. No intracranial aneurysm. IMPRESSION: MRI HEAD IMPRESSION: 1. 13 mm early subacute nonhemorrhagic  ischemic infarct involving the superior right internal capsule. 2. Otherwise unremarkable brain MRI for age. MRA HEAD IMPRESSION: 1. Negative intracranial MRA for large vessel occlusion. 2. Moderate atheromatous irregularity with and stenoses involving the supraclinoid ICAs bilaterally, right greater than left. 3. Additional atherosclerotic change elsewhere within the intracranial circulation as above. No other proximal high-grade or correctable stenosis identified. Electronically Signed   By: Jeannine Boga M.D.   On: 01/23/2018 00:19   Ct Head Code Stroke Wo Contrast  Result Date: 01/22/2018 CLINICAL DATA:  Code stroke. Acute onset of left-sided facial droop and left-sided weakness. EXAM: CT HEAD WITHOUT CONTRAST TECHNIQUE: Contiguous axial images were obtained from the base of the skull through the vertex without intravenous contrast. COMPARISON:  None. FINDINGS: Brain: No acute infarct, hemorrhage, or mass lesion is present. Basal ganglia are intact. Insular ribbon is normal. No acute or focal cortical abnormality is present. Mild white matter changes are present. The brainstem and cerebellum are normal. Vascular: Atherosclerotic calcifications are present in the cavernous internal carotid arteries bilaterally. No asymmetric hyperdense vessel is present. Skull: Calvarium is intact. No focal lytic or blastic lesions are present. Sinuses/Orbits: The paranasal sinuses and mastoid air cells are clear. Globes and orbits are within normal limits. ASPECTS [facility] Stroke Program Early CT Score) - Ganglionic level infarction (caudate, lentiform nuclei, internal capsule, insula, M1-M3 cortex): 7/7 - Supraganglionic infarction (M4-M6 cortex): 3/3 Total score (0-10 with 10 being normal): 10/10 IMPRESSION: 1. No acute intracranial abnormality. 2. Mild atrophy and white matter changes are within normal limits for age. 3. Atherosclerosis 4. ASPECTS is 10/10 The above was relayed via text pager to Dr. Lorraine Lax on  01/22/2018 at 10:59 . Electronically Signed   By: San Morelle M.D.   On: 01/22/2018 11:00   2D Echocardiogram  - Left ventricle: The cavity size was normal. There was  mild   concentric hypertrophy. Systolic function was normal. The   estimated ejection fraction was in the range of 55% to 60%.   Doppler parameters are consistent with abnormal left ventricular   relaxation (grade 1 diastolic dysfunction). - Aortic valve: Mildly thickened, mildly calcified leaflets. There   was mild to moderate regurgitation. - Mitral valve: Calcified annulus. There was trivial regurgitation. - Left atrium: The atrium was mildly dilated.  Impressions:  - No cardiac source of emboli was indentified.   PHYSICAL EXAM Pleasant elderly caucasian male not in distress. . Afebrile. Head is nontraumatic. Neck is supple without bruit.    Cardiac exam no murmur or gallop. Lungs are clear to auscultation. Distal pulses are well felt.  Neurological Exam ;  Awake  Alert oriented x 3. Normal speech and language.diminished attention, registration and recall. Poor insight into his illness.eye movements full without nystagmus.fundi were not visualized. Vision acuity and fields appear normal. Hearing is normal. Palatal movements are normal. Face asymmetric mild left lower facial weakness.. Tongue midline. Normal strength, tone, reflexes and coordinationexcept diminished fine finger movements on the left. Orbits right over left upper extremity.. Normal sensation. Gait steady.   ASSESSMENT/PLAN Arthur Mcdaniel is a 84 y.o. male with history of PAF, memory deficit, melanoma, ischemic heart disease, hypothyroidism, hyperlipidemia, gout, GERD presenting with L sided weakness and L facial droop.    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 Small vessel disease. Atrophy. atherosclerosis     CTA head & neck no ELVO. Atherosclerosis R 40%, L  20%. Distal vessel atherosclerosis. 50% VA origin stenosis.  MRI  R internal capsule infarct  MRA  No ELVO. R>L ICA stenosis. Atherosclerosis.  2D Echo  EF 55-60%. No source of embolus   LDL 57  HgbA1c 6.4  Lovenox 40 mg sq daily for VTE prophylaxis  aspirin 81 mg daily prior to admission, now on aspirin 81 mg daily and clopidogrel 75 mg daily x 3 weeks then plavix alone   Therapy recommendations:  HH SLP, HH PT, OT eval pending   Disposition:  Pending. family planning to get home care for them  Hx Paroxsymal Atrial Fibrillation  Home anticoagulation:  none  . Current stroke is not embolic . Do not feel he is a good anticoagulation candidate given baseline dementia with head-strong ideas and difficulty to get him to take meds, flight risk. Wife also with cognitive deficits and not effective in controlling him  Hypertension  Stable . BP goal normotensive  Hyperlipidemia  Home meds:  zocor 20  Now on lipitor 80 in hospital  LDL 57, goal < 70  Ok to resume home statin dose at d/c  Diabetes type II  HgbA1c 6.4, at goal < 7.0  Other Stroke Risk Factors  Advanced age  Other Active Problems  Baseline cognitive decline, family having difficulty getting him to take meds. Lives w/ wife who has some cognitive decline as well. dtr from [facility] and son in [place]. Needs OP memory evaluation.   Hospital day # 0  Burnetta Sabin, MSN, APRN, ANVP-BC, AGPCNP-BC Advanced Practice Stroke Nurse [place] for Schedule & Pager information 01/23/2018 11:59 AM  I have personally examined this patient, reviewed notes, independently viewed imaging studies, participated in medical decision making and plan of care.ROS completed by me personally and pertinent positives fully documented  I have made any additions or clarifications directly to the  above note. Agree with note above.presented with left-sided face and body weakness secondary to right subcortical infarct  from small vessel disease. Recommend dual antiplatelet therapy for 3 weeks followed by Plavix alone. Aggressive risk factor modification. Patient and wife may need consideration for moving into assisted living as both of them have memory impairment and may have trouble remembering to take the medications. Long discussion with the patient's daughter at the bedside and answered questions. Discussed with Dr. Eliseo Squires.Greater than 50% time during this 25 minute visit was spent on counseling and coordination of care about his lacunar infarct and answering questions  Antony Contras, [place] Pager: [phone] 01/23/2018 4:09 PM  To contact Stroke Continuity provider, please refer to [email or web address] After hours, contact General Neurology

## 2018-01-23 NOTE — Plan of Care (Signed)
  Problem: Education: Goal: Knowledge of General Education information will improve Description: Including pain rating scale, medication(s)/side effects and non-pharmacologic comfort measures Outcome: Progressing   Problem: Health Behavior/Discharge Planning: Goal: Ability to manage health-related needs will improve Outcome: Progressing   Problem: Clinical Measurements: Goal: Ability to maintain clinical measurements within normal limits will improve Outcome: Progressing Goal: Will remain free from infection Outcome: Progressing Goal: Diagnostic test results will improve Outcome: Progressing Goal: Respiratory complications will improve Outcome: Progressing Goal: Cardiovascular complication will be avoided Outcome: Progressing   Problem: Activity: Goal: Risk for activity intolerance will decrease Outcome: Progressing   Problem: Nutrition: Goal: Adequate nutrition will be maintained Outcome: Progressing   Problem: Coping: Goal: Level of anxiety will decrease Outcome: Progressing   Problem: Elimination: Goal: Will not experience complications related to bowel motility Outcome: Progressing Goal: Will not experience complications related to urinary retention Outcome: Progressing   Problem: Pain Managment: Goal: General experience of comfort will improve Outcome: Progressing   Problem: Safety: Goal: Ability to remain free from injury will improve Outcome: Progressing   Problem: Skin Integrity: Goal: Risk for impaired skin integrity will decrease Outcome: Progressing   Problem: Education: Goal: Knowledge of disease or condition will improve Outcome: Progressing Goal: Knowledge of secondary prevention will improve Outcome: Progressing Goal: Knowledge of patient specific risk factors addressed and post discharge goals established will improve Outcome: Progressing   Problem: Coping: Goal: Will verbalize positive feelings about self Outcome: Progressing Goal: Will  identify appropriate support needs Outcome: Progressing   Problem: Health Behavior/Discharge Planning: Goal: Ability to manage health-related needs will improve Outcome: Progressing   Problem: Self-Care: Goal: Ability to participate in self-care as condition permits will improve Outcome: Progressing Goal: Verbalization of feelings and concerns over difficulty with self-care will improve Outcome: Progressing Goal: Ability to communicate needs accurately will improve Outcome: Progressing   Problem: Nutrition: Goal: Risk of aspiration will decrease Outcome: Progressing Goal: Dietary intake will improve Outcome: Progressing   Problem: Ischemic Stroke/TIA Tissue Perfusion: Goal: Complications of ischemic stroke/TIA will be minimized Outcome: Progressing   

## 2018-01-23 NOTE — Progress Notes (Signed)
Patient attempted multiple times to get out of bed unassisted , staff assisted him back to his bed and to toilet to urinate .

## 2018-01-23 NOTE — Evaluation (Signed)
Speech Language Pathology Evaluation Patient Details Name: Arthur Mcdaniel MRN: [id number] DOB: 20-Feb-1931 Today's Date: 01/23/2018 Time: 8416-6063 SLP Time Calculation (min) (ACUTE ONLY): 38 min  Problem List:  Patient Active Problem List   Diagnosis Date Noted  . TIA (transient ischemic attack) 01/22/2018  . Mild dementia 07/29/2015  . Essential hypertension 01/15/2015  . Diabetes mellitus type 2, uncomplicated ([place]) 01/60/1093  . Diabetic polyneuropathy associated with diabetes mellitus due to underlying condition ([place]) 01/15/2015  . CAD (coronary artery disease) 01/20/2011  . PAF (paroxysmal atrial fibrillation) ([place]) 01/20/2011  . Hyperlipidemia 01/20/2011   Past Medical History:  Past Medical History:  Diagnosis Date  . Detached retina   . GERD (gastroesophageal reflux disease)   . Gout   . Hyperlipidemia    Myalgias with Lipitor  . Hypothyroidism   . Ischemic heart disease    Remote stent to LCX in 1997  . Melanoma ([place])   . Memory deficit   . Normal nuclear stress test June 2012  . Obese   . PAF (paroxysmal atrial fibrillation) ([place])    Past Surgical History:  Past Surgical History:  Procedure Laterality Date  . CHOLECYSTECTOMY N/A 02/13/2014   Procedure: LAPAROSCOPIC CHOLECYSTECTOMY ;  Surgeon: Autumn Messing III, MD;  Location: [facility];  Service: General;  Laterality: N/A;  . CORONARY STENT PLACEMENT  1997   LCX   HPI:  Arthur Mcdaniel is a 84 y.o. male with medical history significant of afib; dementia; CAD s/p stent; hypothyroidism; and HLD who presented with stroke-like symptoms. MRI 01/23/18 revealed 13 mm early subacute nonhemorrhagic ischemic infarct involving the superior right internal capsule.   Assessment / Plan / Recommendation Clinical Impression   Patient presents with moderate cognitive communication impairment with deficits in memory, orientation, problem solving, higher level (alternating and divided) attention, safety awareness and awareness of  deficits. Despite reorientation by nursing staff, pt disoriented to location and situation, unable to state location as a hospital even when given a choice from f:3. He was sitting at the nursing station when SLP approached because he had been refusing to stay in his room. Daughter expresses a concern about pt's continued ability to live independently at home. She reports pt's son has been managing pt's finances (pt denies this). He has continued to drive, though he does report difficulties remembering where he is going or navigating to some locations. SLP administered MOCA-Basic; pt scored 18/30. >26 is considered normal, 18-26 indicates mild cognitive impairment. Pt with decreased insight and awareness of his deficits and potential functional impacts. SLP recommended pt refrain from driving at this time due to decreased attention. Daughter reports she and her brother are considering living options for her parents to provide more support, and SLP agreed that this would be appropriate. For now she requests [place] follow-up until further arrangements can be made. No further acute needs identified; SLP will s/o.      SLP Assessment  SLP Recommendation/Assessment: All further Speech Lanaguage Pathology  needs can be addressed in the next venue of care SLP Visit Diagnosis: Cognitive communication deficit (R41.841)    Follow Up Recommendations  Home health SLP;Outpatient SLP;24 hour supervision/assistance    Frequency and Duration           SLP Evaluation Cognition  Overall Cognitive Status: Impaired/Different from baseline Arousal/Alertness: Awake/alert Orientation Level: Oriented to person;Disoriented to time;Disoriented to situation;Disoriented to place Attention: Sustained Sustained Attention: Appears intact Memory: Impaired Memory Impairment: Decreased short term memory;Decreased recall of new information;Storage deficit Decreased Short  Term Memory: Verbal basic;Functional basic Awareness:  Impaired Awareness Impairment: Intellectual impairment;Emergent impairment;Anticipatory impairment Problem Solving: Impaired Problem Solving Impairment: Verbal basic Executive Function: Reasoning;Sequencing Reasoning: Impaired Reasoning Impairment: Verbal complex Sequencing: Impaired Sequencing Impairment: Verbal basic Behaviors: Restless;Impulsive Safety/Judgment: Impaired       Comprehension  Auditory Comprehension Overall Auditory Comprehension: Appears within functional limits for tasks assessed Visual Recognition/Discrimination Discrimination: Within Function Limits Reading Comprehension Reading Status: Not tested    Expression Expression Primary Mode of Expression: Verbal Verbal Expression Overall Verbal Expression: Impaired Initiation: No impairment Automatic Speech: Name;Social Response Level of Generative/Spontaneous Verbalization: Conversation Naming: Impairment Confrontation: Impaired Divergent: 75-100% accurate(75%; several perseverations) Other Naming Comments: no wordfinding difficulties apparent in conversation Verbal Errors: Semantic paraphasias(caterpillar/butterfly) Pragmatics: No impairment Written Expression Dominant Hand: Right Written Expression: Not tested   Oral / Motor  Oral Motor/Sensory Function Overall Oral Motor/Sensory Function: Within functional limits Motor Speech Overall Motor Speech: Appears within functional limits for tasks assessed   [place], [place], [place] Pager: [phone] Office: [phone]  Aliene Altes 01/23/2018, 10:32 AM

## 2018-01-23 NOTE — Care Management Note (Addendum)
Case Management Note  Patient Details  Name: Arthur Mcdaniel MRN: [id number] Date of Birth: 08/21/1930  Subjective/Objective:  84 y.o. male with medical history significant of afib; dementia; CAD s/p stent; hypothyroidism; and HLD who presented with stroke-like symptoms.         Action/Plan: CM met with patient, spouse and daughter to discuss transitional needs. Patient lives at home with spouse, has been independent with his ADLs, and continues to drive. PCP: Dr. Deland Pretty.  Patient's daughter expressed concerns about patient continuing to live independently at home with spouse d/t both parents experiencing cognitive impairments. Daughter reported living in [place], [place] and her brother lives in [place]. [place] discussed therapy recommendations for [facility] services with 24hr assist, RW; daughter indicated staying locally for a few days to provide assistance, while discussing other living options with her brother. CM discussed with daughter ordering a HHSW post transition to assist family with community resources, with daughter agreeable. [place] referral given to Butch Penny, [facility] liaison for [facility] PT/OT/ST/SW; RW referral given to Jeneen Rinks [facility] DME liaison with AVS updated. Patient's daughter will provide transportation home.   Expected Discharge Date:  01/23/18               Expected Discharge Plan:  [place]  In-House Referral:  NA  Discharge planning Services  CM Consult  Post Acute Care Choice:  Home Health, Durable Medical Equipment Choice offered to:  Adult Children(Patient with dementia)  DME Arranged:  [place] DME Agency:  [place] Arranged:  PT, OT, Social Work, Theme park manager Therapy HH Agency:  [place]  Status of Service:  Completed, signed off  If discussed at [employer], dates discussed:    Additional Comments:  Midge Minium RN, BSN, NCM-BC, ACM-RN [phone] 01/23/2018, 11:10 AM

## 2018-01-26 ENCOUNTER — Telehealth: Payer: Self-pay | Admitting: Neurology

## 2018-01-26 NOTE — Telephone Encounter (Signed)
Patient's daughter is calling in stating that he's had stroke on Monday morning (16th). Did not show up on CAT scan but was showing on MRI. Subacute Stroke. She just wanted to keep the nurse and Doctor updated on what was going on. If you need to call her it's [phone]. Thanks!

## 2018-01-29 NOTE — Telephone Encounter (Signed)
LMOM for [employer] below.

## 2018-01-29 NOTE — Telephone Encounter (Signed)
Pls let daughter know I reviewed records, as well as recommendations, including recommendation for assisted living. I will discuss this with him on his follow-up on Friday, we can provide her with resources to assist. Thanks

## 2018-02-01 ENCOUNTER — Telehealth: Payer: Self-pay | Admitting: Neurology

## 2018-02-01 NOTE — Telephone Encounter (Signed)
Patient's daughter is calling in stating that her dad had a spell last night. He was thinking it was back 40 years ago and this lasted for about an hour. She also said that he had a stroke on the 16th and was hospitalized for a day. She just wanted to give the update before his appt tomorrow. If you need to call her back it's [phone]. Thanks!

## 2018-02-01 NOTE — Telephone Encounter (Signed)
Noted. Forwarding to Dr. Aquino as FYI 

## 2018-02-02 ENCOUNTER — Encounter: Payer: Self-pay | Admitting: Neurology

## 2018-02-02 ENCOUNTER — Encounter

## 2018-02-02 ENCOUNTER — Other Ambulatory Visit: Payer: Self-pay

## 2018-02-02 ENCOUNTER — Ambulatory Visit: Payer: Medicare Other | Admitting: Neurology

## 2018-02-02 VITALS — BP 138/66 | HR 78 | Ht 71.0 in | Wt 203.0 lb

## 2018-02-02 DIAGNOSIS — F03A0 Unspecified dementia, mild, without behavioral disturbance, psychotic disturbance, mood disturbance, and anxiety: Secondary | ICD-10-CM

## 2018-02-02 DIAGNOSIS — F0390 Unspecified dementia without behavioral disturbance: Secondary | ICD-10-CM

## 2018-02-02 DIAGNOSIS — G3184 Mild cognitive impairment, so stated: Secondary | ICD-10-CM | POA: Diagnosis not present

## 2018-02-02 DIAGNOSIS — I639 Cerebral infarction, unspecified: Secondary | ICD-10-CM | POA: Diagnosis not present

## 2018-02-02 MED ORDER — DONEPEZIL HCL 10 MG PO TABS: 10.0000 mg | ORAL_TABLET | Freq: Every day | ORAL | 3 refills | Status: AC

## 2018-02-02 NOTE — Patient Instructions (Signed)
1. Continue Donepezil 10mg  daily and Memantine 10mg  twice a day 2. Continue aspirin and Plavix for another 2 weeks, then stop aspirin and continue Plavix daily 3. No further driving 4. Follow-up in 6 months or so, call for any changes  FALL PRECAUTIONS: Be cautious when walking. Scan the area for obstacles that may increase the risk of trips and falls. When getting up in the mornings, sit up at the edge of the bed for a few minutes before getting out of bed. Consider elevating the bed at the head end to avoid drop of blood pressure when getting up. Walk always in a well-lit room (use night lights in the walls). Avoid area rugs or power cords from appliances in the middle of the walkways. Use a walker or a cane if necessary and consider physical therapy for balance exercise. Get your eyesight checked regularly.  FINANCIAL OVERSIGHT: Supervision, especially oversight when making financial decisions or transactions is also recommended.  HOME SAFETY: Consider the safety of the kitchen when operating appliances like stoves, microwave oven, and blender. Consider having supervision and share cooking responsibilities until no longer able to participate in those. Accidents with firearms and other hazards in the house should be identified and addressed as well.  DRIVING: Regarding driving, in patients with progressive memory problems, driving will be impaired. We advise to have someone else do the driving if trouble finding directions or if minor accidents are reported. Independent driving assessment is available to determine safety of driving.  ABILITY TO BE LEFT ALONE: If patient is unable to contact 911 operator, consider using LifeLine, or when the need is there, arrange for someone to stay with patients. Smoking is a fire hazard, consider supervision or cessation. Risk of wandering should be assessed by caregiver and if detected at any point, supervision and safe proof recommendations should be  instituted.  MEDICATION SUPERVISION: Inability to self-administer medication needs to be constantly addressed. Implement a mechanism to ensure safe administration of the medications.  RECOMMENDATIONS FOR ALL PATIENTS WITH MEMORY PROBLEMS: 1. Continue to exercise (Recommend 30 minutes of walking everyday, or 3 hours every week) 2. Increase social interactions - continue going to [place] and enjoy social gatherings with friends and family 3. Eat healthy, avoid fried foods and eat more fruits and vegetables 4. Maintain adequate blood pressure, blood sugar, and blood cholesterol level. Reducing the risk of stroke and cardiovascular disease also helps promoting better memory. 5. Avoid stressful situations. Live a simple life and avoid aggravations. Organize your time and prepare for the next day in anticipation. 6. Sleep well, avoid any interruptions of sleep and avoid any distractions in the bedroom that may interfere with adequate sleep quality 7. Avoid sugar, avoid sweets as there is a strong link between excessive sugar intake, diabetes, and cognitive impairment The Mediterranean diet has been shown to help patients reduce the risk of progressive memory disorders and reduces cardiovascular risk. This includes eating fish, eat fruits and green leafy vegetables, nuts like almonds and hazelnuts, walnuts, and also use olive oil. Avoid fast foods and fried foods as much as possible. Avoid sweets and sugar as sugar use has been linked to worsening of memory function.  There is always a concern of gradual progression of memory problems. If this is the case, then we may need to adjust level of care according to patient needs. Support, both to the patient and caregiver, should then be put into place.

## 2018-02-02 NOTE — Progress Notes (Signed)
NEUROLOGY FOLLOW UP OFFICE NOTE  Arthur Mcdaniel [id number] 1930-05-23  HISTORY OF PRESENT ILLNESS: I had the pleasure of seeing Arthur Mcdaniel in follow-up in the neurology clinic on 02/02/2018.  The patient was last seen a year ago for mild cognitive impairment. He is again accompanied by wife and daughter who help supplement the history today.  Records and images were personally reviewed where available. MMSE in September 2018 was 23/30 (September 2017 was 23/30, 23/30 in March 2017, 26/30 in September 2016).  Since his last visit, he was admitted on 01/22/18 for left-sided weakness upon awakening. I personally reviewed MRI brain which showed a right internal capsule infarct. No significant white matter disease. CTA head and neck showed R>L ICA stenosis, echocardiogram showed EF 55-60%, LDL 57, HbA1c 6.4. Stroke felt secondary to small vessel disease. He was advised to take aspirin and Plavix for 3 weeks, then Plavix monotherapy. It was noted he was not a good anticoagulation candidate for PAF due to dementia with "head-strong ideas and difficulty to get him to take medications, flight risk." Wife also has cognitive deficits and not effective in controlling him. His daughter reported an episode 2 nights prior where he "went back 40 years" in the evening and upset about not driving. He has minimal insight into his condition, reporting that his memory is fine and that he continues to manage finances. His son took over a year ago after their daughter visited and noticed some things. It took 6-8 months to get things squared away. He feels he has not noticed any great lapse in memory and repeatedly asks today how to improve his memory. His daughter reports there were medication changes made that he was not doing, they could not find medication bottles in the home. They think he was not taking his medications as instructed. They have an aide coming twice a day to administer medications and drive them around. He  denied getting lost driving, but has not been driving since the stroke. He has home PT and states his left side does not feel as good as the right, but much improved from hospital stay. He denies any headaches, dizziness, focal numbness/tingling, no other falls since the stroke.   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 He had previously been evaluated by neurologist Dr. Rexene Alberts in August 2015. His MMSE at that time was 25/30. MRI brain and neurocognitive testing was recommended. He reports going for the MRI but could not proceed due to claustrophobia. His wife reports that her daughter had noticed the memory changes at that time, but she herself noticed changes more in the past couple of months. His long-term memory is excellent, but he cannot recall things from 2 hours ago. He denies any significant word-finding difficulties, if he can't find the word, he is able to substitute pretty quickly. He would ask the same questions repeatedly, or ask questions about their destination after they talked about where to go. He reports he has always been like this, that he is an Chief Financial Officer and has a habit of asking things repeatedly, looking at a map 5 times to see where he is going. His wife denies any concerns about his driving, they continue to drive to [place] without difficulties. He did get turned around yesterday getting to his appointment here, and had to reschedule to today, but states this was due to  thinking the building was on the other side of the street. He denies any missed bill payments except for one or two in the past year, but states he does find himself rechecking his ledger more often now to see if all the bills have been paid. He denies any missed medications. No difficulties multitasking. No personality changes such as paranoia,  but he does get more aggravated at certain things. No hallucinations. He continues to be active, mowing the lawn and working out at [employer]. 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 He was started on Aricept 10mg  daily in 2016, with no side effects. He denies any significant head injuries. His mother had memory changes in her 58s.   Laboratory Data: 04/2014: Vitamin B12 764 11/2014: TSH 0.736 PAST MEDICAL HISTORY: Past Medical History:  Diagnosis Date  . Detached retina   . GERD (gastroesophageal reflux disease)   . Gout   . Hyperlipidemia    Myalgias with Lipitor  . Hypothyroidism   . Ischemic heart disease    Remote stent to LCX in 1997  . Melanoma ([facility])   . Memory deficit   . Normal nuclear stress test June 2012  . Obese   . PAF (paroxysmal atrial fibrillation) ([facility])     MEDICATIONS: Current Outpatient Medications on File Prior to Visit  Medication Sig Dispense Refill  . allopurinol (ZYLOPRIM) 300 MG tablet Take 300 mg by mouth daily.      Marland Kitchen amLODipine (NORVASC) 2.5 MG tablet Take 1 tablet (2.5 mg total) by mouth daily. 90 tablet 2  . aspirin 81 MG tablet Take 81 mg by mouth daily.      . clopidogrel (PLAVIX) 75 MG tablet Take 1 tablet (75 mg total) by mouth daily. 30 tablet 0  . donepezil (ARICEPT) 10 MG tablet Take 1 tablet at bedtime (Patient taking differently: Take 10 mg by mouth at bedtime. ) 90 tablet 3  . levothyroxine (SYNTHROID, LEVOTHROID) 112 MCG tablet Take 112 mcg by mouth daily before breakfast. Take 1 tablet daily  0  . nitroGLYCERIN (NITROSTAT) 0.4 MG SL tablet Place 1 tablet (0.4 mg total) under the tongue every 5 (five) minutes as needed. (Patient taking differently: Place 0.4 mg under the tongue every 5 (five) minutes as needed for chest pain. ) 25 tablet 11  . pyridOXINE (VITAMIN B-6) 100 MG tablet Take 100 mg by mouth daily.    . Saxagliptin-Metformin (KOMBIGLYZE XR) 09-998 MG TB24 Take 1 tablet by  mouth daily.    . simvastatin (ZOCOR) 20 MG tablet Take 20 mg by mouth at bedtime.      . vitamin B-12 (CYANOCOBALAMIN) 1000 MCG tablet Take 1,000 mcg by mouth daily.       No current facility-administered medications on file prior to visit.     ALLERGIES: Allergies  Allergen Reactions  . Naproxen Other (See Comments)    Doesn't remember     FAMILY HISTORY: Family History  Problem Relation Age of Onset  . Heart disease Sister     SOCIAL HISTORY: Social History   Socioeconomic History  . Marital status: Married    Spouse name: [name]  . Number of children: 2  . Years of education: 80  . Highest education level: Not on file  Occupational History  . Occupation: Retired  Scientific laboratory technician  . Financial resource strain: Not on file  . Food insecurity:    Worry: Not on file  Inability: Not on file  . Transportation needs:    Medical: Not on file    Non-medical: Not on file  Tobacco Use  . Smoking status: Never Smoker  . Smokeless tobacco: Never Used  Substance and Sexual Activity  . Alcohol use: No    Alcohol/week: 0.0 standard drinks  . Drug use: No  . Sexual activity: Not on file  Lifestyle  . Physical activity:    Days per week: Not on file    Minutes per session: Not on file  . Stress: Not on file  Relationships  . Social connections:    Talks on phone: Not on file    Gets together: Not on file    Attends religious service: Not on file    Active member of club or organization: Not on file    Attends meetings of clubs or organizations: Not on file    Relationship status: Not on file  . Intimate partner violence:    Fear of current or ex partner: Not on file    Emotionally abused: Not on file    Physically abused: Not on file    Forced sexual activity: Not on file  Other Topics Concern  . Not on file  Social History Narrative   Patient is right handed and resides with wife in a home    REVIEW OF SYSTEMS: Constitutional: No fevers, chills, or sweats, no  generalized fatigue, change in appetite Eyes: No visual changes, double vision, eye pain Ear, nose and throat: No hearing loss, ear pain, nasal congestion, sore throat Cardiovascular: No chest pain, palpitations Respiratory:  No shortness of breath at rest or with exertion, wheezes GastrointestinaI: No nausea, vomiting, diarrhea, abdominal pain, fecal incontinence Genitourinary:  No dysuria, urinary retention or frequency Musculoskeletal:  No neck pain, back pain Integumentary: No rash, pruritus, skin lesions Neurological: as above Psychiatric: No depression, insomnia, anxiety Endocrine: No palpitations, fatigue, diaphoresis, mood swings, change in appetite, change in weight, increased thirst Hematologic/Lymphatic:  No anemia, purpura, petechiae. Allergic/Immunologic: no itchy/runny eyes, nasal congestion, recent allergic reactions, rashes  PHYSICAL EXAM: Vitals:   02/02/18 0842  BP: 138/66  Pulse: 78  SpO2: 97%   General: No acute distress Head:  Normocephalic/atraumatic Neck: supple, no paraspinal tenderness, full range of motion Heart:  Regular rate and rhythm Lungs:  Clear to auscultation bilaterally Back: No paraspinal tenderness Skin/Extremities: No rash, no edema Neurological Exam: alert and oriented to person, place, day of week/season. States it is July 2016. No aphasia or dysarthria. Fund of knowledge is reduced.  Recent and remote memory are impaired.  Attention and concentration are normal.    Able to name objects and repeat phrases. CDT 5/5. Able to name 84 F words in 1 minute (nl > 11). MMSE - Mini Mental State Exam 02/02/2018 02/03/2017 02/03/2016  Orientation to time 2 1 2   Orientation to Place 4 5 4   Registration 3 3 3   Attention/ Calculation 5 5 5   Recall 0 0 0  Language- name 2 objects 2 2 2   Language- repeat 1 1 1   Language- follow 3 step command 3 3 3   Language- read & follow direction 1 1 1   Write a sentence 1 1 1   Copy design 1 1 1   Total score 23 23 23      Cranial nerves: Pupils equal, round, reactive to light.  Extraocular movements intact with no nystagmus. Visual fields full. Facial sensation intact.Shallow left nasolabial foldTongue, uvula, palate midline.  Motor: Bulk and tone normal, muscle  strength 5/5 throughout with no pronator drift, no orbiting noted today.  Sensation to light touch, temperature and vibration intact.  No extinction to double simultaneous stimulation.  Deep tendon reflexes +2 throughout, toes downgoing.  Finger to nose testing intact.  Gait narrow-based and steady, able to tandem walk adequately.  Romberg negative.  IMPRESSION: This is a pleasant 84 yo RH man with a vascular risk factors including hypertension, hyperlipidemia, diabetes, paroxysmal atrial fibrillation, with mild dementia. He had a right subcortical stroke recently with no residual deficits. He is on dual antiplatelet therapy for another 2 weeks, then switch to Plavix 75mg  daily. Since his last visit, there has been decline in memory, now with difficulties with complex tasks. MMSE is 23/30 today (similar to prior visits), Memantine 10mg  BID has been added by his PCP, continue Donepezil 10mg  daily as well. Expectations from the medications were discussed. We discussed the diagnosis of dementia, he has minimal insight into it, but agrees to stop driving. Continue with current plans for caregivers at home, their children are looking into assisted living facilities for both of them, which I agree with. We discussed the importance of control of vascular risk factors, physical exercise, and brain stimulation exercises for brain health.  They know to go to the ER for any sudden change in symptoms. He will follow-up in 6 months and knows to call our office for any changes.   Thank you for allowing me to participate in his care.  Please do not hesitate to call for any questions or concerns.  The duration of this appointment visit was 30 minutes of face-to-face time with the  patient.  Greater than 50% of this time was spent in counseling, explanation of diagnosis, planning of further management, and coordination of care.   Ellouise Newer, M.D.   CC: Dr. Shelia Media

## 2018-02-19 ENCOUNTER — Encounter: Payer: Self-pay | Admitting: Adult Health

## 2018-02-19 ENCOUNTER — Ambulatory Visit: Payer: Medicare Other | Admitting: Adult Health

## 2018-02-19 VITALS — BP 132/66 | HR 70 | Wt 201.0 lb

## 2018-02-19 DIAGNOSIS — I10 Essential (primary) hypertension: Secondary | ICD-10-CM

## 2018-02-19 DIAGNOSIS — E119 Type 2 diabetes mellitus without complications: Secondary | ICD-10-CM | POA: Diagnosis not present

## 2018-02-19 DIAGNOSIS — I480 Paroxysmal atrial fibrillation: Secondary | ICD-10-CM | POA: Diagnosis not present

## 2018-02-19 DIAGNOSIS — I639 Cerebral infarction, unspecified: Secondary | ICD-10-CM

## 2018-02-19 DIAGNOSIS — E785 Hyperlipidemia, unspecified: Secondary | ICD-10-CM

## 2018-02-19 NOTE — Progress Notes (Addendum)
[facility] [address] [place]. [place] [address] [phone]       OFFICE FOLLOW UP NOTE  Mr. TRAVARES NELLES Date of Birth:  1931-02-11 Medical Record Number:  [id number]   Reason for Referral:  hospital stroke follow up  CHIEF COMPLAINT:  Chief Complaint  Patient presents with  . Follow-up    Hospital Stroke follow up room 9 , pt sees Dr. 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 History obtained from patient, wife, caregiver and chart review. Reviewed all radiology images and labs personally.  Mr. BRANNDON TUITE is a 84 y.o. male with history of PAF, memory deficit, melanoma, ischemic heart disease, hypothyroidism, hyperlipidemia, gout, GERD  who presented with L sided weakness and L facial droop.  Initially EMS was called per notes and upon arrival patient and wife both refused transport to the hospital.  Over time, patient lost all strength in his left arm and left leg therefore neighbor convinced him to call EMS and go to the hospital.  Upon arrival, strength in his left arm and leg had improved but not back to baseline.  He was not a candidate for TPA due to being outside the window.  CT head personally reviewed and was negative for acute stroke.  CTA head and neck was negative for emergent LVO but did show arthrosclerosis right ICA 70%, left ICA 50% and distal vessel arthrosclerosis with 50% VA origin stenosis.  MRI brain personally reviewed and showed right internal capsule infarct.  MRA was negative for emergent large vessel occlusion and showed right > left ICA stenosis.  2D echo showed an EF of 55 to 60% without evidence of cardiac source of embolus.  LDL 57 and recommended continuation of Zocor 20 mg.  HTN stable during admission and recommended long-term BP goal normotensive range.  A1c  satisfactory 6.4 and recommend continue follow-up with PCP for DM management.  Patient does have a history of PAF and patient was determined to not be a good candidate for anticoagulation due to baseline of dementia along with difficulty taking medications in the past.  Patient was on aspirin 81 mg PTA and recommended DAPT for 3 weeks and Plavix alone.  Therapy recommended home health for continued therapies and was discharged home with wife in stable condition.  Per notes, it was noted that patient also has a history of some cognitive decline and it was recommended to consider being moved into assisted living due to forgetfulness with medication management along with other assistance as family does not live in the area.  Patient is being seen today for hospital follow-up and is accompanied by his wife and caregiver.  He has recovered well from his recent stroke without residual deficits.  He did receive home PT once discharge but has completed therapy needs.  It is believed that he has completed 3 weeks DAPT and is on Plavix alone at this time without side effects of bleeding or bruising.  Caregiver believes that the daughter has removed aspirin from his medication boxes but will ensure this is happen once they return home.  He has recently been switched from simvastatin to Crestor 20 mg and continues Crestor without side effects or myalgias.  Blood pressure today satisfactory 132/66.  He has not continued monitoring glucose levels per caregiver as his daughter  did not want him to continue "poking himself" due to new blood thinner.  He does receive caregiver assistance 7 days a week for 3 hours in the morning and 3 hours in the afternoon for assistance with transportation.  Caregiver states that he takes his medications independently as his daughter will set up his pillboxes with all needed medications.  She declines assisting with ADLs or other IADLs.  She has no concerns of them living independently at this time  and is unsure if daughter or son are looking into AFL at this time.  He is followed by Dr. Delice Lesch at [facility] neurology prior to the stroke for dementia.  No further concerns.  Denies new or worsening stroke/TIA symptoms.   ROS:   14 system review of systems performed and negative with exception of memory loss, confusion and decreased energy  PMH:  Past Medical History:  Diagnosis Date  . Detached retina   . GERD (gastroesophageal reflux disease)   . Gout   . Hyperlipidemia    Myalgias with Lipitor  . Hypothyroidism   . Ischemic heart disease    Remote stent to LCX in 1997  . Melanoma ([place])   . Memory deficit   . Normal nuclear stress test June 2012  . Obese   . PAF (paroxysmal atrial fibrillation) (HCC)     PSH:  Past Surgical History:  Procedure Laterality Date  . CHOLECYSTECTOMY N/A 02/13/2014   Procedure: LAPAROSCOPIC CHOLECYSTECTOMY ;  Surgeon: Autumn Messing III, MD;  Location: [facility];  Service: General;  Laterality: N/A;  . CORONARY STENT PLACEMENT  1997   LCX    Social History:  Social History   Socioeconomic History  . Marital status: Married    Spouse name: [name]  . Number of children: 2  . Years of education: 30  . Highest education level: Not on file  Occupational History  . Occupation: Retired  Scientific laboratory technician  . Financial resource strain: Not on file  . Food insecurity:    Worry: Not on file    Inability: Not on file  . Transportation needs:    Medical: Not on file    Non-medical: Not on file  Tobacco Use  . Smoking status: Never Smoker  . Smokeless tobacco: Never Used  Substance and Sexual Activity  . Alcohol use: No    Alcohol/week: 0.0 standard drinks  . Drug use: No  . Sexual activity: Not on file  Lifestyle  . Physical activity:    Days per week: Not on file    Minutes per session: Not on file  . Stress: Not on file  Relationships  . Social connections:    Talks on phone: Not on file    Gets together: Not on file    Attends religious  service: Not on file    Active member of club or organization: Not on file    Attends meetings of clubs or organizations: Not on file    Relationship status: Not on file  . Intimate partner violence:    Fear of current or ex partner: Not on file    Emotionally abused: Not on file    Physically abused: Not on file    Forced sexual activity: Not on file  Other Topics Concern  . Not on file  Social History Narrative   Patient is right handed and resides with wife in a home    Family History:  Family History  Problem Relation Age of Onset  . Heart disease Sister  Medications:   Current Outpatient Medications on File Prior to Visit  Medication Sig Dispense Refill  . allopurinol (ZYLOPRIM) 300 MG tablet Take 300 mg by mouth daily.      Marland Kitchen amLODipine (NORVASC) 2.5 MG tablet Take 1 tablet (2.5 mg total) by mouth daily. 90 tablet 2  . donepezil (ARICEPT) 10 MG tablet Take 1 tablet (10 mg total) by mouth at bedtime. 90 tablet 3  . ezetimibe (ZETIA) 10 MG tablet Take 10 mg by mouth daily.  4  . levothyroxine (SYNTHROID, LEVOTHROID) 112 MCG tablet Take 112 mcg by mouth daily before breakfast. Take 1 tablet daily  0  . memantine (NAMENDA) 10 MG tablet Take 10 mg by mouth 2 (two) times daily.    . metFORMIN (GLUCOPHAGE) 850 MG tablet Take 850 mg by mouth 2 (two) times daily with a meal.    . nitroGLYCERIN (NITROSTAT) 0.4 MG SL tablet Place 1 tablet (0.4 mg total) under the tongue every 5 (five) minutes as needed. (Patient taking differently: Place 0.4 mg under the tongue every 5 (five) minutes as needed for chest pain. ) 25 tablet 11  . pyridOXINE (VITAMIN B-6) 100 MG tablet Take 100 mg by mouth daily.    . rosuvastatin (CRESTOR) 20 MG tablet TAKE 1 TABLET BY MOUTH ONCE DAILY FOR 90 DAYS  4  . vitamin B-12 (CYANOCOBALAMIN) 1000 MCG tablet Take 1,000 mcg by mouth daily.       No current facility-administered medications on file prior to visit.     Allergies:   Allergies  Allergen  Reactions  . Naproxen Other (See Comments)    Doesn't remember      Physical Exam  Vitals:   02/19/18 0811  BP: 132/66  Pulse: 70  Weight: 201 lb (91.2 kg)   Body mass index is 28.03 kg/m. No exam data present  General: well developed, well nourished, pleasant elderly Caucasian male, seated, in no evident distress Head: head normocephalic and atraumatic.   Neck: supple with no carotid or supraclavicular bruits Cardiovascular: regular rate and rhythm, no murmurs Musculoskeletal: no deformity Skin:  no rash/petichiae Vascular:  Normal pulses all extremities  Neurologic Exam Mental Status: Awake and fully alert.  Oriented to time but disoriented to place. Remote memory intact. Attention span and concentration appropriate but difficulty with fund of knowledge. Mood and affect appropriate and cooperative with exam.  Cranial Nerves: Fundoscopic exam reveals sharp disc margins. Pupils equal, briskly reactive to light. Extraocular movements full without nystagmus. Visual fields full to confrontation. Hearing intact. Facial sensation intact. Face, tongue, palate moves normally and symmetrically.  Motor: Normal bulk and tone. Normal strength in all tested extremity muscles. Sensory.: intact to touch , pinprick , position and vibratory sensation.  Coordination: Rapid alternating movements normal in all extremities. Finger-to-nose and heel-to-shin performed accurately bilaterally. Gait and Station: Arises from chair without difficulty. Stance is normal. Gait demonstrates normal stride length and balance without need of assistive device. Able to heel, toe and tandem walk without difficulty.  Reflexes: 1+ and symmetric. Toes downgoing.    NIHSS  0 Modified Rankin  0 HAS-BLED 2  CHA2DS2-VASc 6   Diagnostic Data (Labs, Imaging, Testing)  CT HEAD WO CONTRAST 01/22/2018 IMPRESSION: 1. No acute intracranial abnormality. 2. Mild atrophy and white matter changes are within normal  limits for age. 3. Atherosclerosis 4. ASPECTS is 10/10  CT ANGIO HEAD W OR WO CONTRAST CT ANGIO NECK W OR WO CONTRAST 01/22/2018 IMPRESSION: No acute large or medium vessel occlusion identified.  Atherosclerotic calcification at both carotid bifurcation and ICA regions. 40% stenosis of the proximal ICA on the right. 20% stenosis of the proximal ICA on the left. Atherosclerotic disease in the carotid siphon regions and supraclinoid internal carotid arteries. 70% stenosis of the supraclinoid ICA on the right. 50% stenosis of the supraclinoid ICA on the left. Distal vessel intracranial atherosclerotic irregularity of the anterior circulation branches. 50% stenosis at each vertebral artery origin. Plaque of the distal vertebral arteries but without stenosis greater than 30% suspected. No vertebral stenosis. Posterior circulation branch vessels show distal atherosclerotic irregularity.  MR BRAIN WO CONTRAST MR MRA HEAD  01/22/2018 IMPRESSION: MRI HEAD IMPRESSION: 1. 13 mm early subacute nonhemorrhagic ischemic infarct involving the superior right internal capsule. 2. Otherwise unremarkable brain MRI for age. MRA HEAD IMPRESSION: 1. Negative intracranial MRA for large vessel occlusion. 2. Moderate atheromatous irregularity with and stenoses involving the supraclinoid ICAs bilaterally, right greater than left. 3. Additional atherosclerotic change elsewhere within the intracranial circulation as above. No other proximal high-grade or correctable stenosis identified.  ECHOCARDIOGRAM 01/22/2018 Study Conclusions - Left ventricle: The cavity size was normal. There was mild   concentric hypertrophy. Systolic function was normal. The   estimated ejection fraction was in the range of 55% to 60%.   Doppler parameters are consistent with abnormal left ventricular   relaxation (grade 1 diastolic dysfunction). - Aortic valve: Mildly thickened, mildly calcified leaflets. There   was mild to  moderate regurgitation. - Mitral valve: Calcified annulus. There was trivial regurgitation. - Left atrium: The atrium was mildly dilated.   ASSESSMENT: PASCHAL BLANTON is a 84 y.o. year old male here with right internal capsule infarct on 01/22/2018 secondary to small vessel disease. Vascular risk factors include dementia, HTN, HLD, DM and PAF not on AC as patient not good candidate for anticoagulation due to dementia and inconsistencies in medication compliance.  Patient is followed by Dr. Delice Lesch for dementia with recent MMSE 23/30.  Patient is being seen today for hospital follow-up and has recovered well without residual deficits or reoccurring symptoms.    PLAN: -Continue clopidogrel 75 mg daily  and Crestor 20 mg for secondary stroke prevention -Advised caregiver to ensure that he has completely stopped aspirin at this time and continue on Plavix only -F/u with PCP regarding your HLD, HTN and DM management -continue to monitor BP at home -advised to continue to stay active and maintain a healthy diet -Advised patient that this time he should not continue to drive due to dementia concerns with lack of insight on his cognitive deficits -Maintain strict control of hypertension with blood pressure goal below 130/90, diabetes with hemoglobin A1c goal below 6.5% and cholesterol with LDL cholesterol (bad cholesterol) goal below 70 mg/dL. I also advised the patient to eat a healthy diet with plenty of whole grains, cereals, fruits and vegetables, exercise regularly and maintain ideal body weight.  Follow up as needed or call with questions, concerns or need of future follow-up appointment Advised to continue to follow with Dr. Delice Lesch at [facility] neurology for dementia management and stroke follow-up as this helped limits amount of provider appointments.  Did advise caregiver, wife and patient that if Dr. Delice Lesch prefers patient to be followed in this office for stroke, they will be called to schedule  III month follow-up.   Greater than 50% of time during this 25 minute visit was spent on counseling,explanation of diagnosis of right internal capsule infarct, reviewing risk factor management of HLD, HTN, DM, PAF not  on [facility] and dementia, planning of further management, discussion with patient and family and coordination of care    Venancio Poisson, AGNP-BC  [facility] [address] [place], [place] [address]  Phone [phone] Fax [phone] Note: This document was prepared with digital dictation and possible smart phrase technology. Any transcriptional errors that result from this process are unintentional.

## 2018-02-19 NOTE — Progress Notes (Signed)
I agree with the above plan 

## 2018-02-19 NOTE — Patient Instructions (Signed)
Continue clopidogrel 75 mg daily  and Crestor  for secondary stroke prevention  Ensure you have stopped aspirin completely at this time and only on Plavix   Continue to follow up with PCP regarding cholesterol, blood pressure and diabetes management   Continue to stay active and maintain a healthy diet  Continue to monitor blood pressure and glucose at home  Maintain strict control of hypertension with blood pressure goal below 130/90, diabetes with hemoglobin A1c goal below 6.5% and cholesterol with LDL cholesterol (bad cholesterol) goal below 70 mg/dL. I also advised the patient to eat a healthy diet with plenty of whole grains, cereals, fruits and vegetables, exercise regularly and maintain ideal body weight.  Followup in the future if needed or call earlier if needed You will continue to follow up with Dr. Delice Lesch for dementia related concerns and stroke follow up        Thank you for coming to see [place] at [facility]. I hope we have been able to provide you high quality care today.  You may receive a patient satisfaction survey over the next few weeks. We would appreciate your feedback and comments so that we may continue to improve ourselves and the health of our patients.

## 2018-02-20 ENCOUNTER — Telehealth: Payer: Self-pay | Admitting: Adult Health

## 2018-02-20 MED ORDER — CLOPIDOGREL BISULFATE 75 MG PO TABS: 75.0000 mg | ORAL_TABLET | Freq: Every day | ORAL | 2 refills | Status: AC

## 2018-02-20 NOTE — Telephone Encounter (Signed)
Order placed for 6 months of plavix refill. After this time, PCP should continue to rx. Thank you

## 2018-02-20 NOTE — Addendum Note (Signed)
Addended by: Venancio Poisson on: 02/20/2018 12:52 PM   Modules accepted: Orders

## 2018-02-20 NOTE — Telephone Encounter (Signed)
Called daughter back and relayed Jessica's message below. She verbalized understanding and appreciation. Verified rx was sent to correct pharmacy per daughter request.

## 2018-02-20 NOTE — Telephone Encounter (Signed)
Patient daughter Arthur Mcdaniel called in stating she was wondering who is going to order the Plavis. Please contact her at [phone].

## 2018-02-22 ENCOUNTER — Telehealth: Payer: Self-pay | Admitting: Neurology

## 2018-02-22 NOTE — Telephone Encounter (Signed)
Princess is calling in wanting to know if this patient has driving restrictions. She is a care taker for him.

## 2018-02-23 NOTE — Telephone Encounter (Signed)
Telephone number below is pt's number.  I called, asked for Princess, and pt stated "I ain't got no princesses here"  No number listed in chart for Princess.  At [place] (02/02/18) Dr. Delice Lesch instructed pt to no long drive.

## 2018-07-17 ENCOUNTER — Ambulatory Visit: Payer: Medicare Other | Admitting: Neurology

## 2018-08-17 ENCOUNTER — Telehealth: Payer: Self-pay | Admitting: Cardiology

## 2018-08-17 NOTE — Telephone Encounter (Signed)
LVM to schedule 1 yr. Followup. 08-17-18 ST

## 2019-07-20 IMAGING — CT CT HEAD CODE STROKE
4 series · 16 of 47 positions shown, 18 images · non-contrast
Comparison: None.

CLINICAL DATA: Code stroke. Acute onset of left-sided facial droop
and left-sided weakness.

EXAM:
CT HEAD WITHOUT CONTRAST
TECHNIQUE: Contiguous axial images were obtained from the base of the skull
through the vertex without intravenous contrast.

[Series 3: head 5.0 st · axial · 0.42mm/px · z∈[-76,+44]mm · 7 of 32 slices shown, 9 images]
[im 4/32  brain]
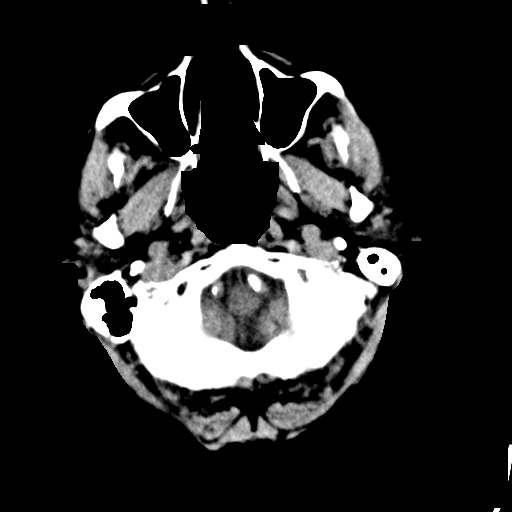
[im 4/32  bone]
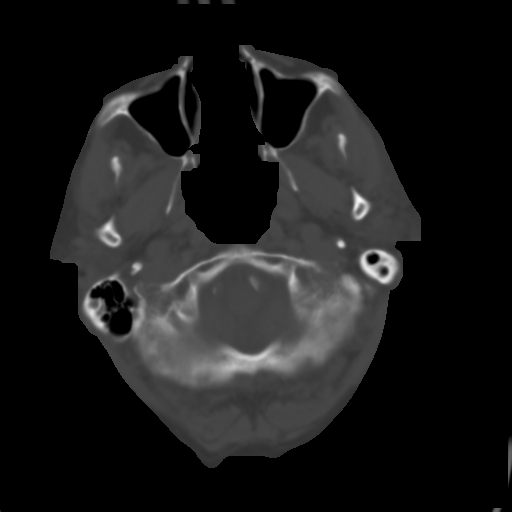
[im 8/32  brain]
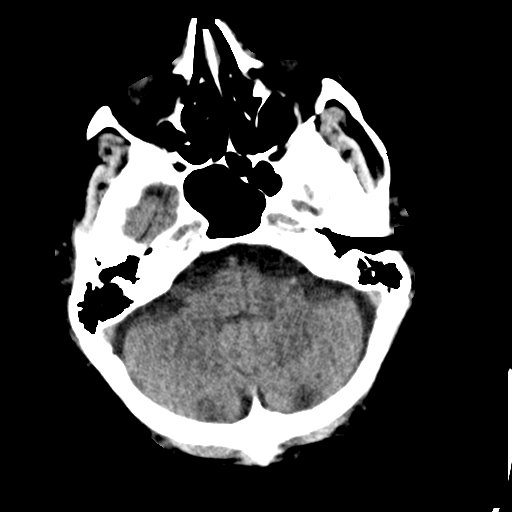
[im 12/32  brain]
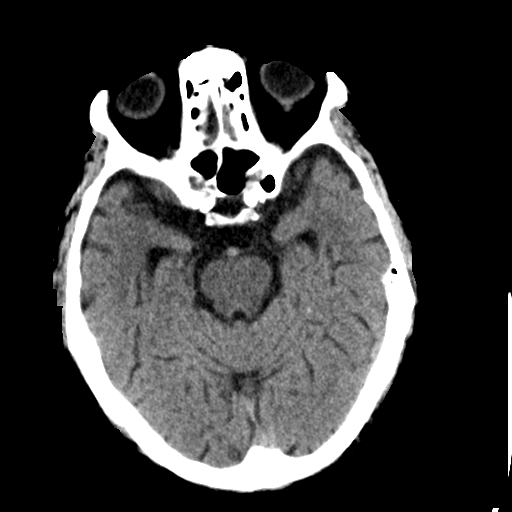
[im 16/32  brain]
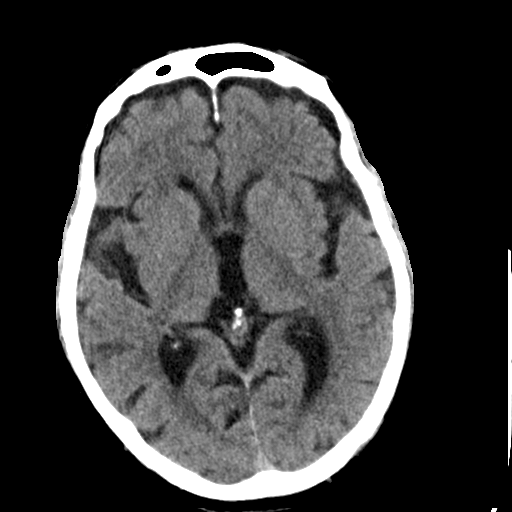
[im 20/32  brain]
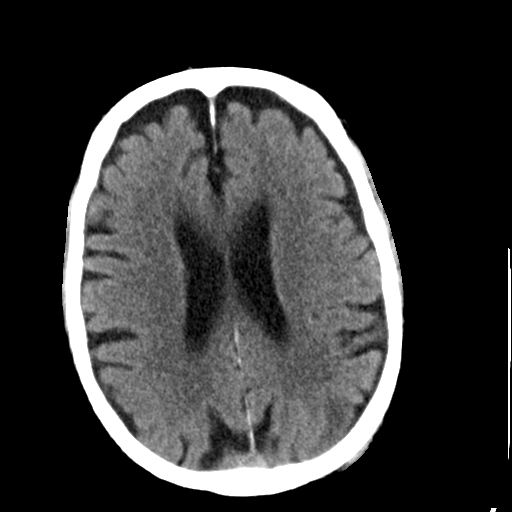
[im 20/32  bone]
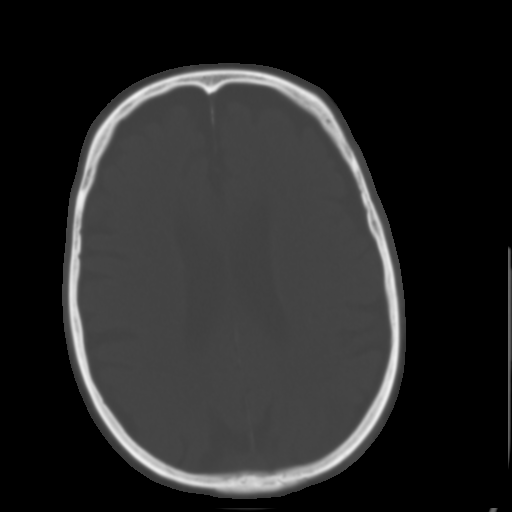
[im 24/32  brain]
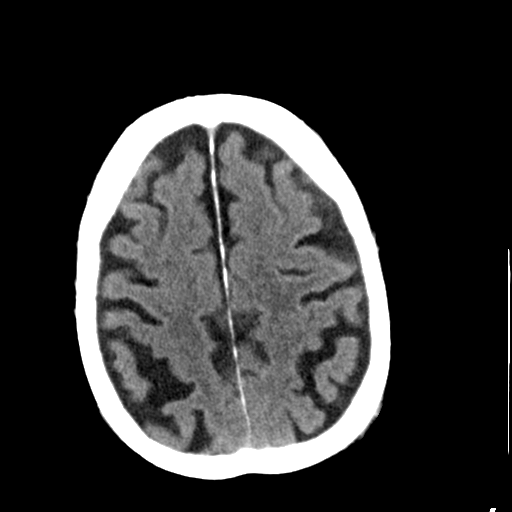
[im 28/32  brain]
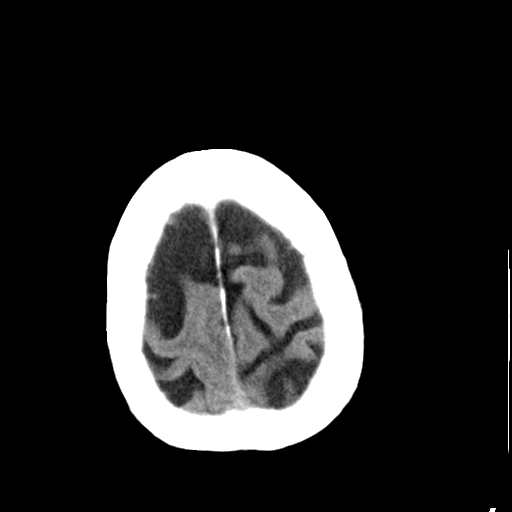

[Series 4: head 2.0 bone · axial · 0.42mm/px · z∈[-77,-45]mm · 3 of 80 slices shown]
[im 8/80  bone]
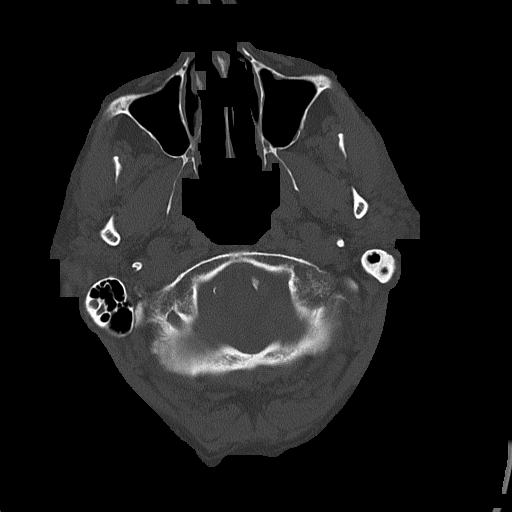
[im 16/80  bone]
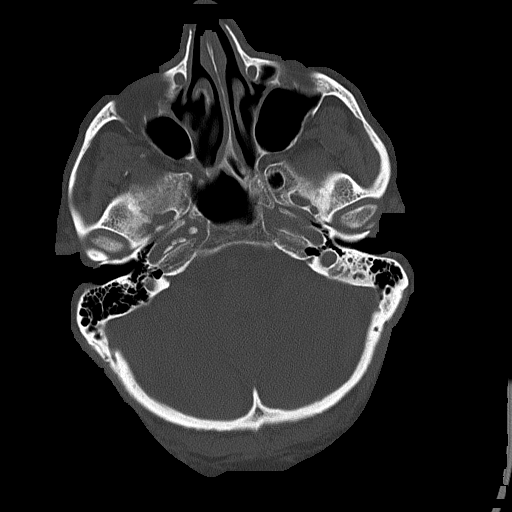
[im 24/80  bone]
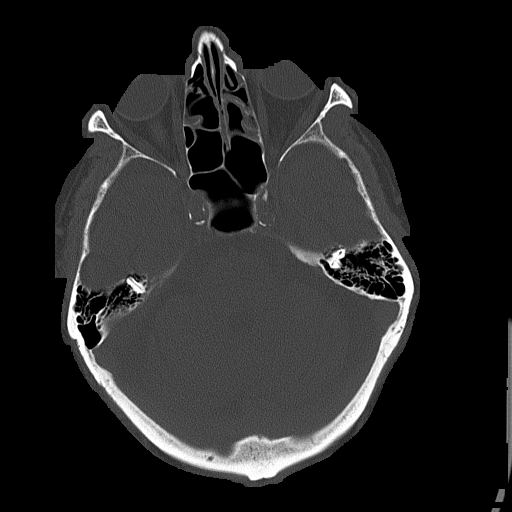

[Series 5: head 3.0 cor st · coronal · 0.31mm/px · 3 of 67 slices shown]
[im 23/67  brain]
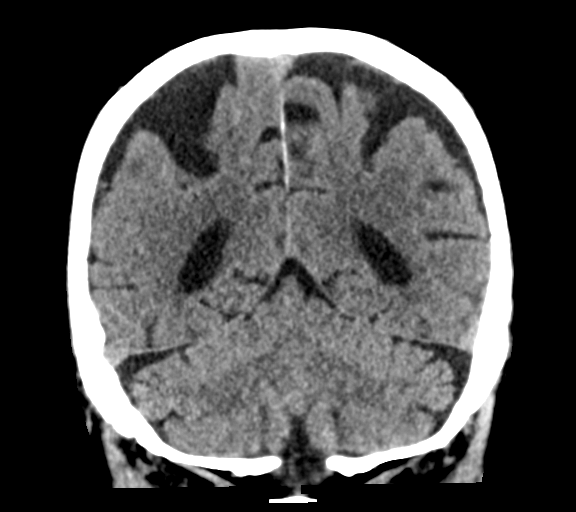
[im 30/67  brain]
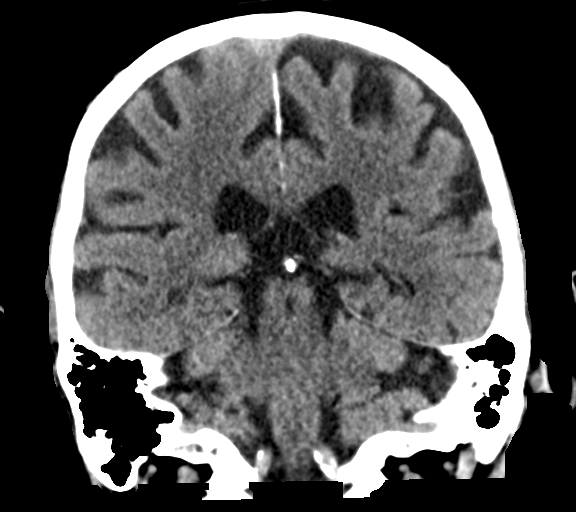
[im 37/67  brain]
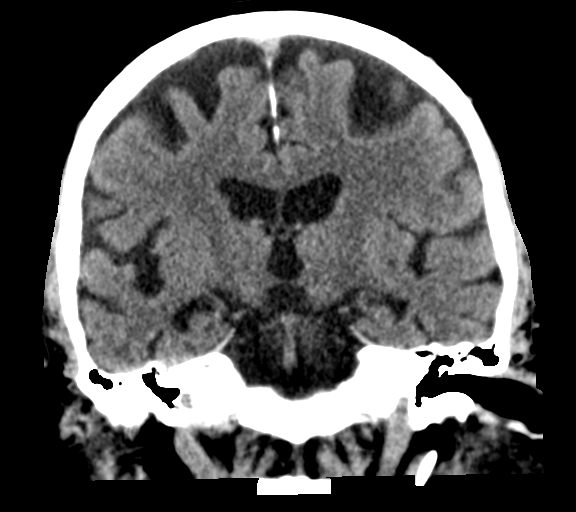

[Series 6: head 3.0 sag st · sagittal · 0.31mm/px · 3 of 67 slices shown]
[im 24/67  brain]
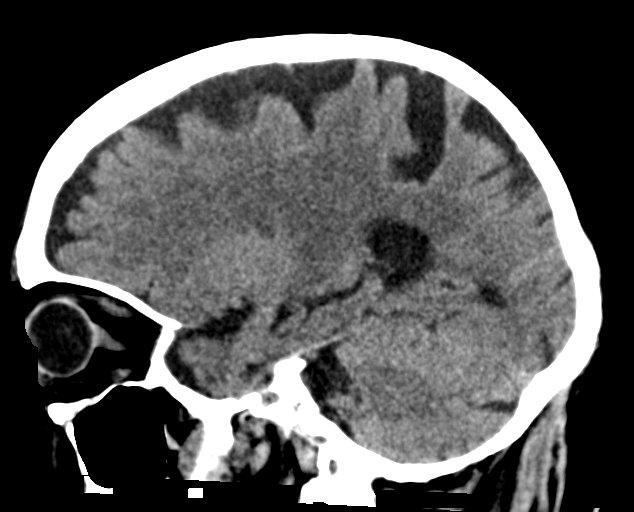
[im 34/67  brain]
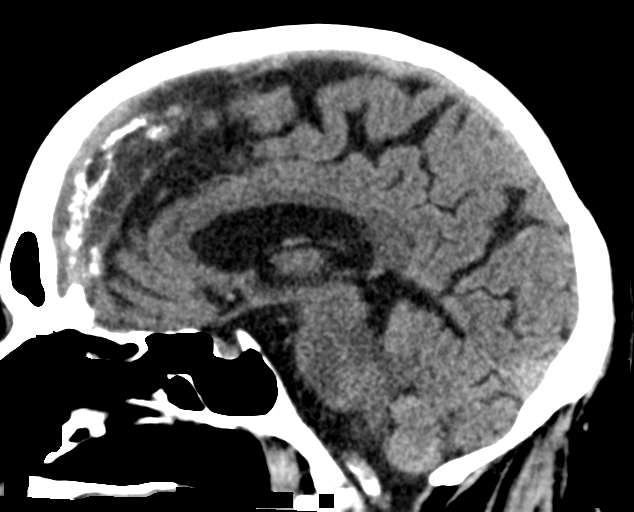
[im 44/67  brain]
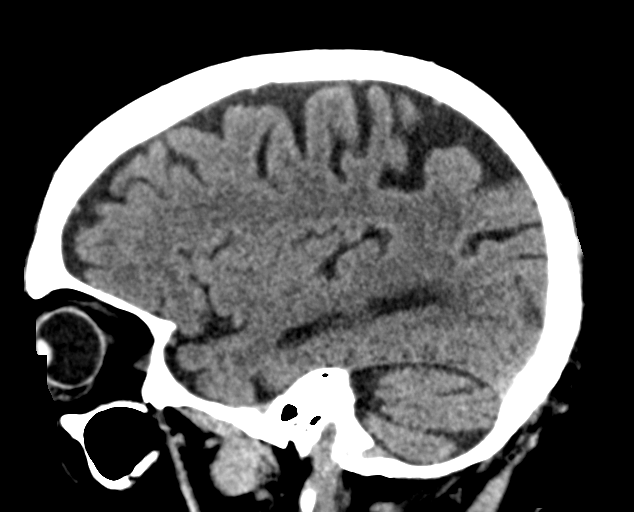

[16 of 47 positions shown; findings below may reference images not displayed]

FINDINGS: Brain: No acute infarct, hemorrhage, or mass lesion is present.
Basal ganglia are intact. Insular ribbon is normal. No acute or
focal cortical abnormality is present. Mild white matter changes are
present. The brainstem and cerebellum are normal.

Vascular: Atherosclerotic calcifications are present in the
cavernous internal carotid arteries bilaterally. No asymmetric
hyperdense vessel is present.

Skull: Calvarium is intact. No focal lytic or blastic lesions are
present.

Sinuses/Orbits: The paranasal sinuses and mastoid air cells are
clear. Globes and orbits are within normal limits.

ASPECTS ([place] Stroke Program Early CT Score)

- Ganglionic level infarction (caudate, lentiform nuclei, internal
capsule, insula, M1-M3 cortex): [DATE]

- Supraganglionic infarction (M4-M6 cortex): [DATE]

Total score (0-10 with 10 being normal): [DATE]
IMPRESSION: 1. No acute intracranial abnormality.
2. Mild atrophy and white matter changes are within normal limits
for age.
3. Atherosclerosis
4. ASPECTS is [DATE]

The above was relayed via text pager to Dr. SURIKOVA on 01/22/2018 at

## 2020-07-07 DEATH — deceased
